# Patient Record
Sex: Female | Born: 1980 | Race: White | Hispanic: No | Smoking: Former smoker
Health system: Southern US, Community
[De-identification: ages and names within clinical notes are randomized; demographics above are authoritative.]

## PROBLEM LIST (undated history)

## (undated) DIAGNOSIS — F1910 Other psychoactive substance abuse, uncomplicated: Secondary | ICD-10-CM

## (undated) HISTORY — PX: APPENDECTOMY: SHX54

---

## 2005-11-09 ENCOUNTER — Emergency Department: Payer: Self-pay | Admitting: Emergency Medicine

## 2006-10-06 ENCOUNTER — Emergency Department: Payer: Self-pay | Admitting: Emergency Medicine

## 2007-09-03 ENCOUNTER — Emergency Department: Payer: Self-pay | Admitting: Emergency Medicine

## 2007-09-04 ENCOUNTER — Inpatient Hospital Stay: Payer: Self-pay | Admitting: Vascular Surgery

## 2010-07-10 ENCOUNTER — Emergency Department: Payer: Self-pay | Admitting: Emergency Medicine

## 2011-02-03 ENCOUNTER — Emergency Department: Payer: Self-pay | Admitting: Emergency Medicine

## 2011-02-07 ENCOUNTER — Emergency Department: Payer: Self-pay | Admitting: Internal Medicine

## 2011-12-23 ENCOUNTER — Emergency Department: Payer: Self-pay | Admitting: Unknown Physician Specialty

## 2011-12-24 LAB — URINALYSIS, COMPLETE
Bacteria: NONE SEEN
Bilirubin,UR: NEGATIVE
Glucose,UR: NEGATIVE mg/dL (ref 0–75)
Ketone: NEGATIVE
Nitrite: NEGATIVE
Protein: NEGATIVE
WBC UR: <1 /HPF (ref 0–5)

## 2011-12-24 LAB — PREGNANCY, URINE: Pregnancy Test, Urine: NEGATIVE m[IU]/mL

## 2012-02-01 ENCOUNTER — Emergency Department: Payer: Self-pay | Admitting: Emergency Medicine

## 2012-05-09 ENCOUNTER — Emergency Department: Payer: Self-pay | Admitting: Emergency Medicine

## 2012-05-09 LAB — URINALYSIS, COMPLETE
Ketone: NEGATIVE
Leukocyte Esterase: NEGATIVE
Ph: 7 (ref 4.5–8.0)
Protein: NEGATIVE
WBC UR: <1 /HPF (ref 0–5)

## 2012-05-09 LAB — CBC
HGB: 13.2 g/dL (ref 12.0–16.0)
MCH: 31.5 pg (ref 26.0–34.0)
MCHC: 32.8 g/dL (ref 32.0–36.0)
MCV: 96 fL (ref 80–100)
Platelet: 300 10*3/uL (ref 150–440)
RBC: 4.2 10*6/uL (ref 3.80–5.20)
RDW: 12.1 % (ref 11.5–14.5)
WBC: 11 10*3/uL (ref 3.6–11.0)

## 2012-05-09 LAB — HCG, QUANTITATIVE, PREGNANCY: Beta Hcg, Quant.: <1 m[IU]/mL — ABNORMAL LOW

## 2012-07-06 ENCOUNTER — Emergency Department: Payer: Self-pay | Admitting: Emergency Medicine

## 2013-10-14 ENCOUNTER — Emergency Department: Payer: Self-pay | Admitting: Emergency Medicine

## 2013-12-23 ENCOUNTER — Emergency Department: Payer: Self-pay | Admitting: Emergency Medicine

## 2014-11-03 ENCOUNTER — Emergency Department: Payer: Self-pay | Admitting: Emergency Medicine

## 2014-11-28 ENCOUNTER — Ambulatory Visit: Payer: Self-pay | Admitting: Otolaryngology

## 2014-12-04 ENCOUNTER — Ambulatory Visit: Payer: Self-pay | Admitting: Otolaryngology

## 2015-11-26 ENCOUNTER — Emergency Department
Admission: EM | Admit: 2015-11-26 | Discharge: 2015-11-26 | Disposition: A | Discharge status: Home / Self Care | Payer: Self-pay | Attending: Emergency Medicine | Admitting: Emergency Medicine

## 2015-11-26 ENCOUNTER — Encounter: Payer: Self-pay | Admitting: Medical Oncology

## 2015-11-26 ENCOUNTER — Emergency Department: Payer: Medicaid Other

## 2015-11-26 DIAGNOSIS — N760 Acute vaginitis: Secondary | ICD-10-CM | POA: Insufficient documentation

## 2015-11-26 DIAGNOSIS — B9689 Other specified bacterial agents as the cause of diseases classified elsewhere: Secondary | ICD-10-CM

## 2015-11-26 DIAGNOSIS — N739 Female pelvic inflammatory disease, unspecified: Secondary | ICD-10-CM | POA: Insufficient documentation

## 2015-11-26 DIAGNOSIS — F17200 Nicotine dependence, unspecified, uncomplicated: Secondary | ICD-10-CM | POA: Insufficient documentation

## 2015-11-26 DIAGNOSIS — Z880 Allergy status to penicillin: Secondary | ICD-10-CM | POA: Insufficient documentation

## 2015-11-26 DIAGNOSIS — Z3202 Encounter for pregnancy test, result negative: Secondary | ICD-10-CM | POA: Insufficient documentation

## 2015-11-26 LAB — URINALYSIS COMPLETE WITH MICROSCOPIC (ARMC ONLY)
BILIRUBIN URINE: NEGATIVE
Bacteria, UA: NONE SEEN
GLUCOSE, UA: NEGATIVE mg/dL
KETONES UR: NEGATIVE mg/dL
NITRITE: NEGATIVE
PH: 5 (ref 5.0–8.0)
Protein, ur: 30 mg/dL — AB
SPECIFIC GRAVITY, URINE: 1.028 (ref 1.005–1.030)

## 2015-11-26 LAB — COMPREHENSIVE METABOLIC PANEL
ALT: 16 U/L (ref 14–54)
AST: 18 U/L (ref 15–41)
Albumin: 3.6 g/dL (ref 3.5–5.0)
Alkaline Phosphatase: 65 U/L (ref 38–126)
Anion gap: 7 (ref 5–15)
BILIRUBIN TOTAL: 0.7 mg/dL (ref 0.3–1.2)
BUN: 9 mg/dL (ref 6–20)
CALCIUM: 9.2 mg/dL (ref 8.9–10.3)
CHLORIDE: 99 mmol/L — AB (ref 101–111)
CO2: 30 mmol/L (ref 22–32)
CREATININE: 0.62 mg/dL (ref 0.44–1.00)
Glucose, Bld: 90 mg/dL (ref 65–99)
Potassium: 3.3 mmol/L — ABNORMAL LOW (ref 3.5–5.1)
Sodium: 136 mmol/L (ref 135–145)
TOTAL PROTEIN: 7.2 g/dL (ref 6.5–8.1)

## 2015-11-26 LAB — POCT PREGNANCY, URINE: Preg Test, Ur: NEGATIVE

## 2015-11-26 LAB — CHLAMYDIA/NGC RT PCR (ARMC ONLY)
Chlamydia Tr: NOT DETECTED
N GONORRHOEAE: DETECTED — AB

## 2015-11-26 LAB — WET PREP, GENITAL
Clue Cells Wet Prep HPF POC: POSITIVE — AB
SPERM: NONE SEEN
Trich, Wet Prep: NONE SEEN
Yeast Wet Prep HPF POC: NONE SEEN

## 2015-11-26 LAB — CBC
HCT: 38.4 % (ref 35.0–47.0)
Hemoglobin: 12.9 g/dL (ref 12.0–16.0)
MCH: 30.7 pg (ref 26.0–34.0)
MCHC: 33.6 g/dL (ref 32.0–36.0)
MCV: 91.3 fL (ref 80.0–100.0)
PLATELETS: 352 10*3/uL (ref 150–440)
RBC: 4.21 MIL/uL (ref 3.80–5.20)
RDW: 12.5 % (ref 11.5–14.5)
WBC: 18 10*3/uL — AB (ref 3.6–11.0)

## 2015-11-26 LAB — LIPASE, BLOOD: LIPASE: 20 U/L (ref 11–51)

## 2015-11-26 MED ORDER — DOXYCYCLINE HYCLATE 100 MG PO TABS
100.0000 mg | ORAL_TABLET | Freq: Once | ORAL | Status: AC
  Administered 2015-11-26: 100 mg via ORAL
  Filled 2015-11-26 (×2): qty 1

## 2015-11-26 MED ORDER — MORPHINE SULFATE (PF) 4 MG/ML IV SOLN
4.0000 mg | Freq: Once | INTRAVENOUS | Status: AC
  Administered 2015-11-26: 4 mg via INTRAVENOUS
  Filled 2015-11-26: qty 1

## 2015-11-26 MED ORDER — MORPHINE SULFATE (PF) 4 MG/ML IV SOLN
4.0000 mg | Freq: Once | INTRAVENOUS | Status: AC
  Administered 2015-11-26: 4 mg via INTRAVENOUS

## 2015-11-26 MED ORDER — SODIUM CHLORIDE 0.9 % IV BOLUS (SEPSIS)
1000.0000 mL | Freq: Once | INTRAVENOUS | Status: AC
  Administered 2015-11-26: 1000 mL via INTRAVENOUS

## 2015-11-26 MED ORDER — OXYCODONE-ACETAMINOPHEN 5-325 MG PO TABS: 1.0000 | ORAL_TABLET | Freq: Four times a day (QID) | ORAL | Status: DC | PRN

## 2015-11-26 MED ORDER — DOXYCYCLINE HYCLATE 100 MG PO TABS: 100.0000 mg | ORAL_TABLET | Freq: Two times a day (BID) | ORAL | Status: DC

## 2015-11-26 MED ORDER — OXYCODONE-ACETAMINOPHEN 5-325 MG PO TABS
ORAL_TABLET | ORAL | Status: AC
  Filled 2015-11-26: qty 2

## 2015-11-26 MED ORDER — ONDANSETRON HCL 4 MG/2ML IJ SOLN
4.0000 mg | Freq: Once | INTRAMUSCULAR | Status: AC
  Administered 2015-11-26: 4 mg via INTRAVENOUS
  Filled 2015-11-26: qty 2

## 2015-11-26 MED ORDER — METRONIDAZOLE 500 MG PO TABS: 500.0000 mg | ORAL_TABLET | Freq: Three times a day (TID) | ORAL | Status: AC

## 2015-11-26 MED ORDER — DEXTROSE 5 % IV SOLN
1.0000 g | Freq: Once | INTRAVENOUS | Status: AC
  Administered 2015-11-26: 1 g via INTRAVENOUS
  Filled 2015-11-26 (×2): qty 10

## 2015-11-26 MED ORDER — PROMETHAZINE HCL 25 MG PO TABS: 25.0000 mg | ORAL_TABLET | Freq: Four times a day (QID) | ORAL | Status: DC | PRN

## 2015-11-26 MED ORDER — OXYCODONE-ACETAMINOPHEN 5-325 MG PO TABS
2.0000 | ORAL_TABLET | Freq: Once | ORAL | Status: AC
  Administered 2015-11-26: 2 via ORAL

## 2015-11-26 NOTE — ED Notes (Signed)
Pt reports that she had her period last week and then today she reports she began having spotting and lower abd pain today. Pt reports she can feel like a cyst when she feels in her vagina.

## 2015-11-26 NOTE — ED Provider Notes (Signed)
[facility] Emergency Department Provider Note  Time seen: 5:32 PM  I have reviewed the triage vital signs and the nursing notes.   HISTORY  Chief Complaint Abdominal Pain    HPI Melanie Howell is a 35 y.o. female with no past medical history who presents to the emergency department with lower abdominal pain. Patient states for the past 3-4 days she has been having lower abdominal pain which has progressively worsened. States she also is having intermittent vaginal spotting. Someone told her it sounds like an ovarian cyst, but the patient states she has never had ovarian cysts in the past. She also states for the past several weeks she has had thick vaginal discharge. Patient's last sexual encounter was approximately 3-4 months ago. Denies dysuria, fever, hematuria. Describes her lower abdominal pain as a 10/10, mostly located in the left lower quadrant. Dull aching pain.     History reviewed. No pertinent past medical history.  There are no active problems to display for this patient.   Past Surgical History  Procedure Laterality Date  . Appendectomy      No current outpatient prescriptions on file.  Allergies Penicillins  No family history on file.  Social History Social History  Substance Use Topics  . Smoking status: Current Some Day Smoker  . Smokeless tobacco: None  . Alcohol Use: No    Review of Systems Constitutional: Negative for fever. Cardiovascular: Negative for chest pain. Respiratory: Negative for shortness of breath. Gastrointestinal: Positive for significant lower abdominal pain. Denies vomiting or diarrhea. Genitourinary: Negative for dysuria. Intermittent vaginal spotting with thick vaginal discharge. Musculoskeletal: Mild lower back pain. Neurological: Negative for headache 10-point ROS otherwise negative.  ____________________________________________   PHYSICAL EXAM:  VITAL SIGNS: ED Triage Vitals  Enc Vitals  Group     BP 11/26/15 1625 130/78 mmHg     Pulse Rate 11/26/15 1625 110     Resp 11/26/15 1625 20     Temp 11/26/15 1625 98.9 F (37.2 C)     Temp Source 11/26/15 1625 Oral     SpO2 11/26/15 1625 100 %     Weight 11/26/15 1625 174 lb (78.926 kg)     Height 11/26/15 1625 5\' 4"  (1.626 m)     Head Cir --      Peak Flow --      Pain Score 11/26/15 1626 7     Pain Loc --      Pain Edu? --      Excl. in GC? --     Constitutional: Alert and oriented. Well appearing and in no distress. Eyes: Normal exam ENT   Head: Normocephalic and atraumatic.   Mouth/Throat: Mucous membranes are moist. Cardiovascular: Normal rate, regular rhythm. No murmur Respiratory: Normal respiratory effort without tachypnea nor retractions. Breath sounds are clear and equal bilaterally. No wheezes/rales/rhonchi. Gastrointestinal: Soft, moderate lower abdominal tenderness mostly located in the suprapubic to left lower quadrant area. No rebound or guarding. No distention. No CVA tenderness. Musculoskeletal: Nontender with normal range of motion in all extremities.  Neurologic:  Normal speech and language. No gross focal neurologic deficits  Skin:  Skin is warm, dry and intact.  Psychiatric: Mood and affect are normal. Speech and behavior are normal. ____________________________________________     RADIOLOGY  Ultrasound most consistent with endometritis  ____________________________________________    INITIAL IMPRESSION / ASSESSMENT AND PLAN / ED COURSE  Pertinent labs & imaging results that were available during my care of the patient were reviewed by  me and considered in my medical decision making (see chart for details).  Patient presents to the emergency department with lower abdominal pain which has progressed over the past 3 days. Also with intermittent spotting, and thick vaginal discharge. Labs shows significantly elevated white blood cell count of 18,000, otherwise within normal limits with a  negative pregnancy test. Urinalysis does show white blood cells, but no bacteria. Patient denies dysuria, this is more likely related to her thick vaginal discharge. We will proceed with pelvic ultrasound, and pelvic examination. Differential this time with include ovarian cyst, hemorrhagic cyst, PID, or UTI.  Pelvic exam shows significant amount of thick white/yellow vaginal discharge coming from the cervix. Significant cervical tenderness as well as right adnexal tenderness. We will treat for PID, I have sent swabs. We will obtain an ultrasound rule out TOA.  Ultrasound consistent with endometritis, no TOA, gonorrhea and bacterial vaginitis have returned positive. We'll place the patient on Flagyl and doxycycline gone home along with pain and nausea medication. The patient is agreeable to plan. I offered the patient admission to the hospital, she would prefer to go home.  ____________________________________________   FINAL CLINICAL IMPRESSION(S) / ED DIAGNOSES  Pelvic inflammatory disease  Minna Antis, MD 11/26/15 2023

## 2015-11-26 NOTE — Discharge Instructions (Signed)
Please take your medications as prescribed. As we discussed please return to the emergency department if you begin vomiting, develop a fever, worsening abdominal pain, or any other symptom personally concerning to yourself. Otherwise please follow up with her primary care physician 1-2 days for recheck.   Bacterial Vaginosis Bacterial vaginosis is an infection of the vagina. It happens when too many germs (bacteria) grow in the vagina. Having this infection puts you at risk for getting other infections from sex. Treating this infection can help lower your risk for other infections, such as:   Chlamydia.  Gonorrhea.  HIV.  Herpes. HOME CARE  Take your medicine as told by your doctor.  Finish your medicine even if you start to feel better.  Tell your sex partner that you have an infection. They should see their doctor for treatment.  During treatment:  Avoid sex or use condoms correctly.  Do not douche.  Do not drink alcohol unless your doctor tells you it is ok.  Do not breastfeed unless your doctor tells you it is ok. GET HELP IF:  You are not getting better after 3 days of treatment.  You have more grey fluid (discharge) coming from your vagina than before.  You have more pain than before.  You have a fever. MAKE SURE YOU:   Understand these instructions.  Will watch your condition.  Will get help right away if you are not doing well or get worse.   This information is not intended to replace advice given to you by your health care provider. Make sure you discuss any questions you have with your health care provider.   Document Released: 08/16/2008 Document Revised: 11/28/2014 Document Reviewed: 06/19/2013 Elsevier Interactive Patient Education 2016 Elsevier Inc.  Pelvic Inflammatory Disease Pelvic inflammatory disease (PID) is an infection in some or all of the female organs. PID can be in the uterus, ovaries, fallopian tubes, or the surrounding tissues that  are inside the lower belly area (pelvis). PID can lead to lasting problems if it is not treated. To check for this disease, your doctor may:  Do a physical exam.  Do blood tests, urine tests, or a pregnancy test.  Look at your vaginal discharge.  Do tests to look inside the pelvis.  Test you for other infections. HOME CARE  Take over-the-counter and prescription medicines only as told by your doctor.  If you were prescribed an antibiotic medicine, take it as told by your doctor. Do not stop taking it even if you start to feel better.  Do not have sex until treatment is done or as told by your doctor.  Tell your sex partner if you have PID. Your partner may need to be treated.  Keep all follow-up visits as told by your doctor. This is important.  Your doctor may test you for infection again 3 months after you are treated. GET HELP IF:  You have more fluid (discharge) coming from your vagina or fluid that is not normal.  Your pain does not improve.  You throw up (vomit).  You have a fever.  You cannot take your medicines.  Your partner has a sexually transmitted disease (STD).  You have pain when you pee (urinate). GET HELP RIGHT AWAY IF:  You have more belly (abdominal) or lower belly pain.  You have chills.  You are not better after 72 hours.   This information is not intended to replace advice given to you by your health care provider. Make sure you discuss  any questions you have with your health care provider.   Document Released: 02/03/2009 Document Revised: 07/29/2015 Document Reviewed: 12/15/2014 Elsevier Interactive Patient Education [employer] Inc.

## 2015-11-26 NOTE — ED Notes (Signed)
Reviewed d/c instructions, prescriptions, follow-up care, and use of heat packs with pt. Pt verbalized understanding.

## 2015-11-26 NOTE — ED Notes (Signed)
MD and ED tech at bedside for pelvic.

## 2015-11-30 ENCOUNTER — Telehealth: Payer: Self-pay | Admitting: Emergency Medicine

## 2015-11-30 NOTE — ED Notes (Signed)
Called patient to tell her std results.  gc positive.  Was treated with ceftriaxone and doxy.  Pt says she could not afford the doxycycline, sohas not continued that.  i told her that it is very important that she take the course of doxycycline due to the PID and that she could be come septic.  She agrees to  Go get the doxy today.

## 2017-04-23 IMAGING — US US PELVIS COMPLETE
1 series · 13 of 25 positions shown · non-contrast
Comparison: None

CLINICAL DATA: Lower abdominal pain for 4 days. Pelvic inflammatory
disease.

EXAM:
TRANSABDOMINAL AND TRANSVAGINAL ULTRASOUND OF PELVIS
TECHNIQUE: Both transabdominal and transvaginal ultrasound examinations of the
pelvis were performed. Transabdominal technique was performed for
global imaging of the pelvis including uterus, ovaries, adnexal
regions, and pelvic cul-de-sac. It was necessary to proceed with
endovaginal exam following the transabdominal exam to visualize the
ovaries and adnexa.

[Series 1: us pelvis complete · 0.25mm/px · 13 of 145 slices shown]
[im 1/145]
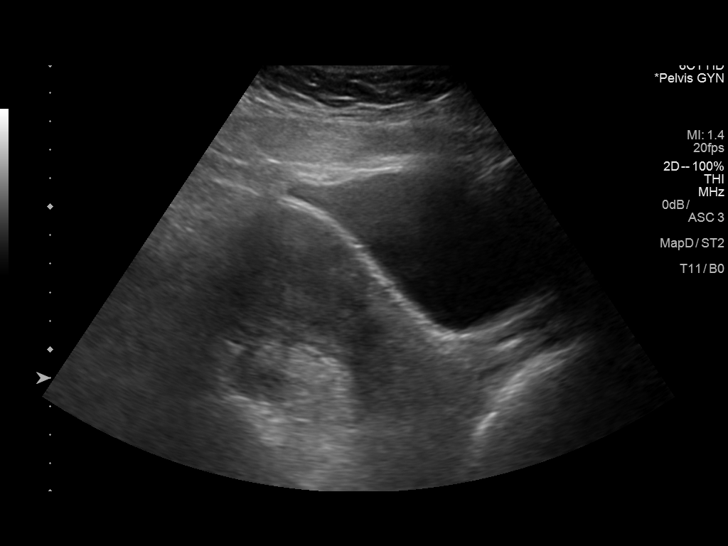
[im 13/145]
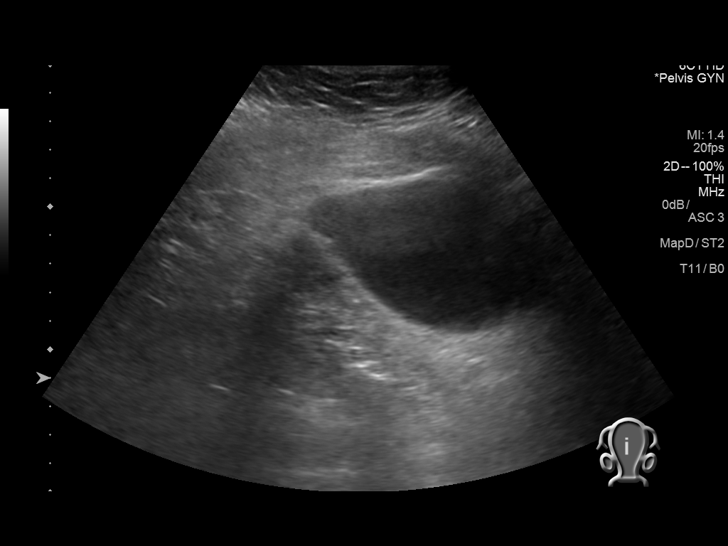
[im 25/145]
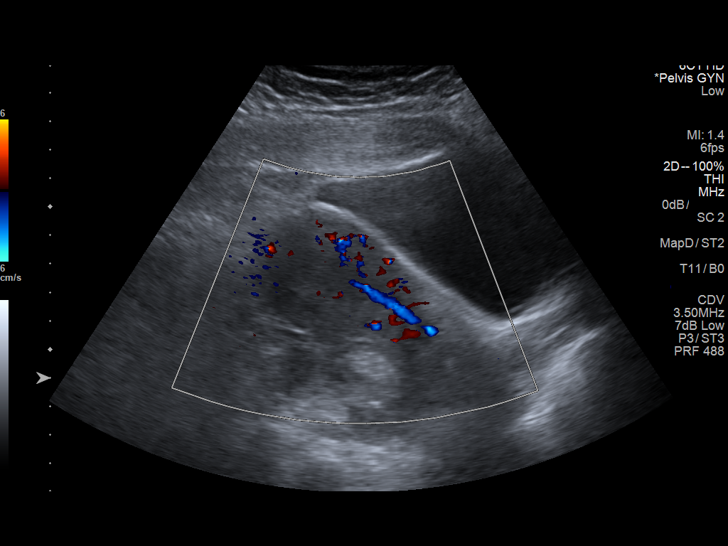
[im 37/145]
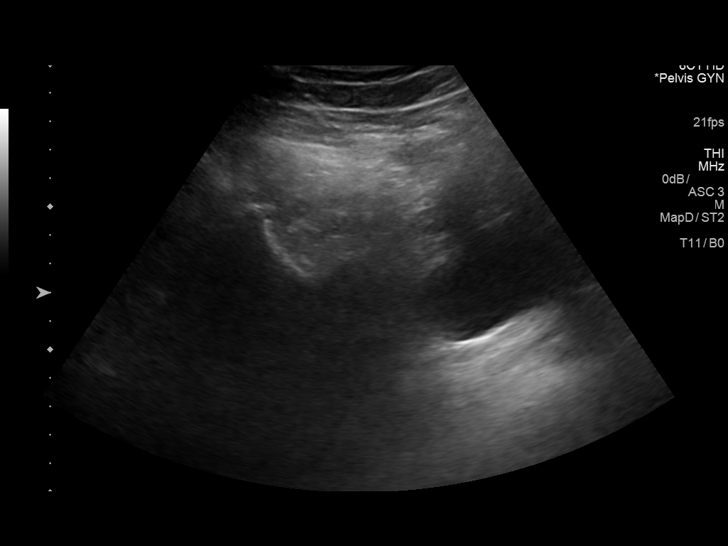
[im 49/145]
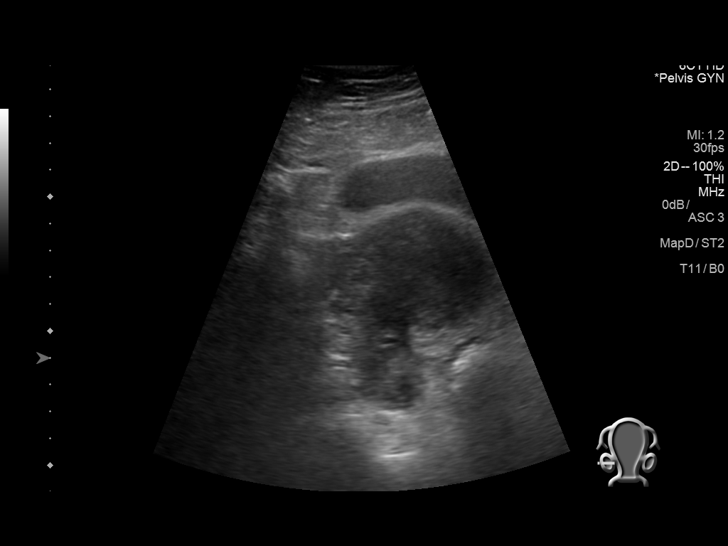
[im 61/145]
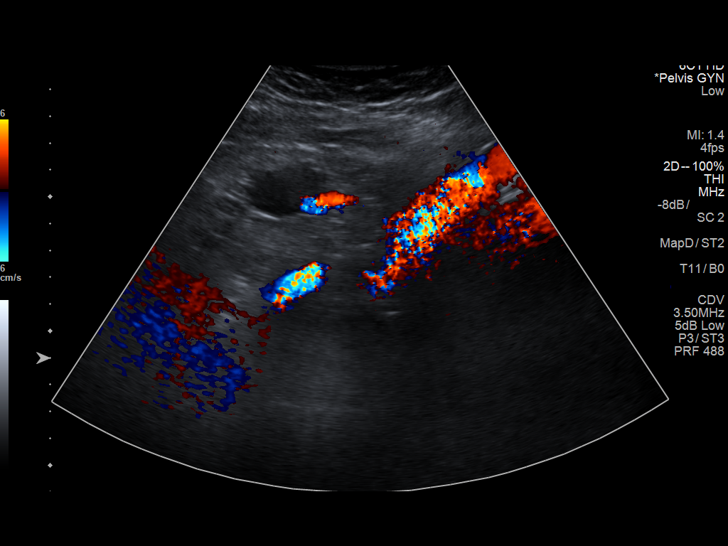
[im 73/145]
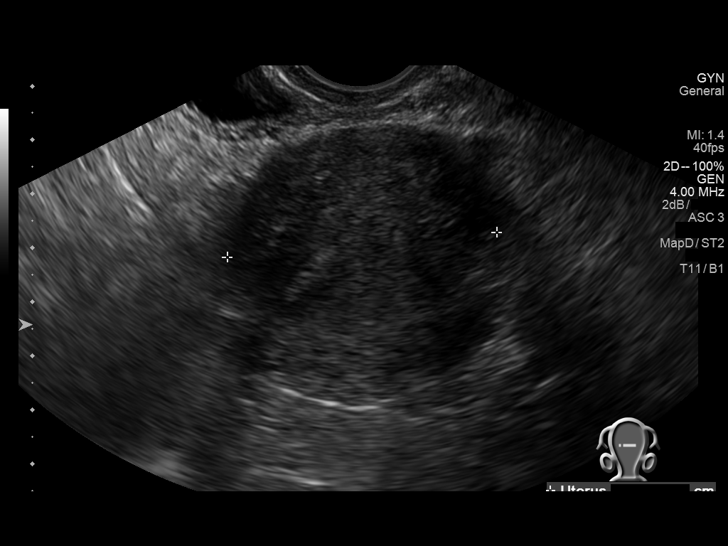
[im 85/145]
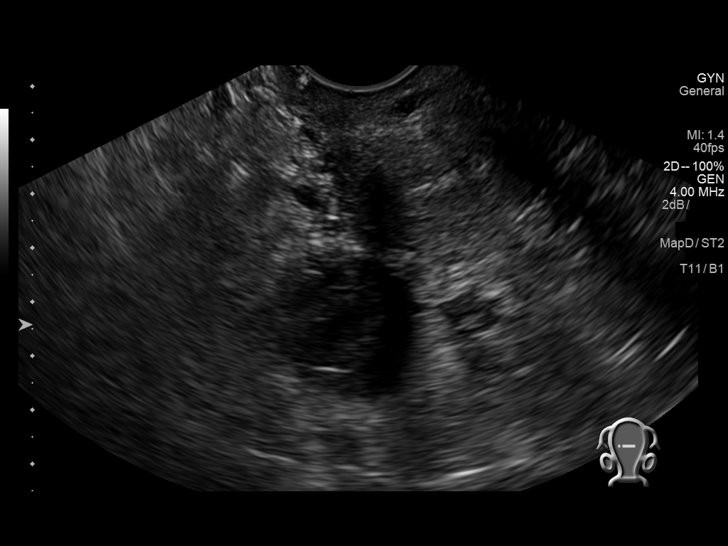
[im 97/145]
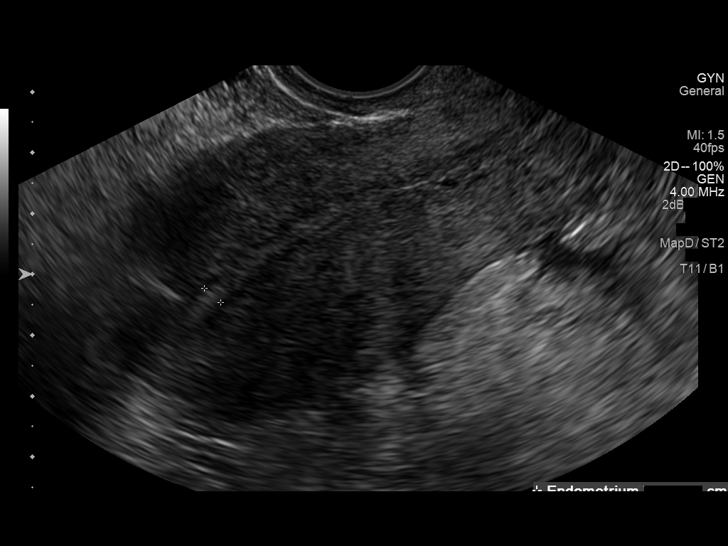
[im 109/145]
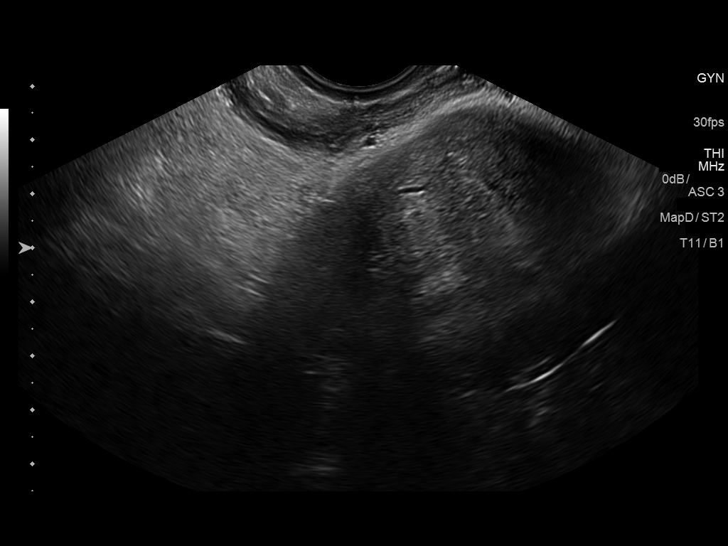
[im 121/145]
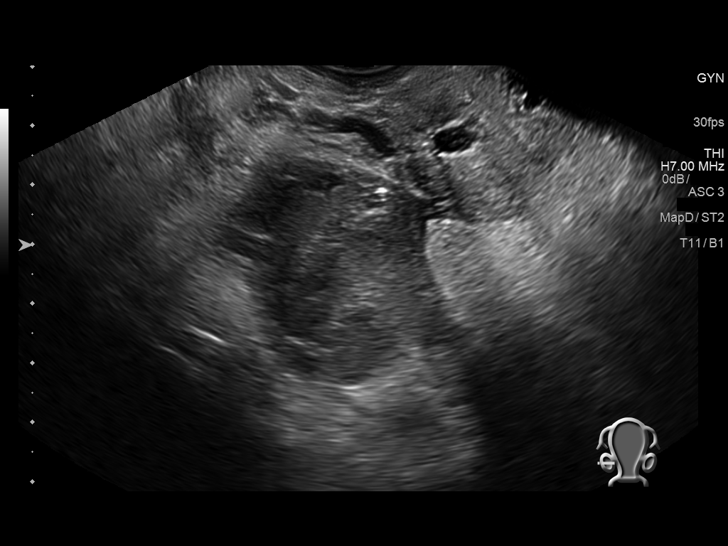
[im 133/145]
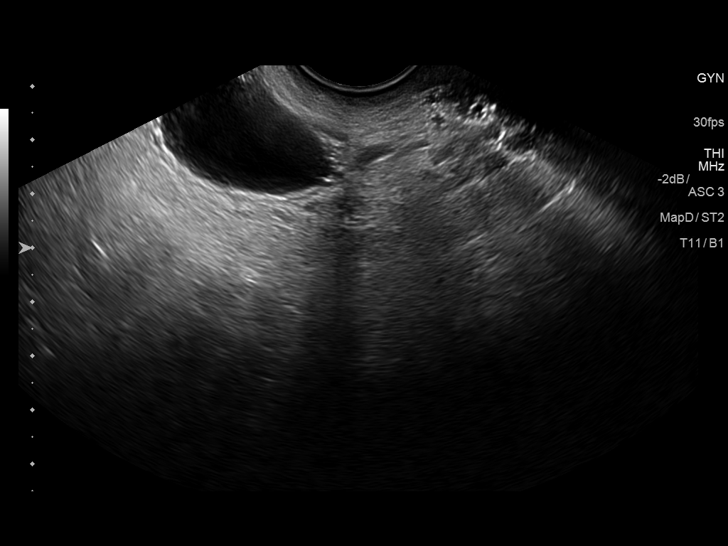
[im 145/145]
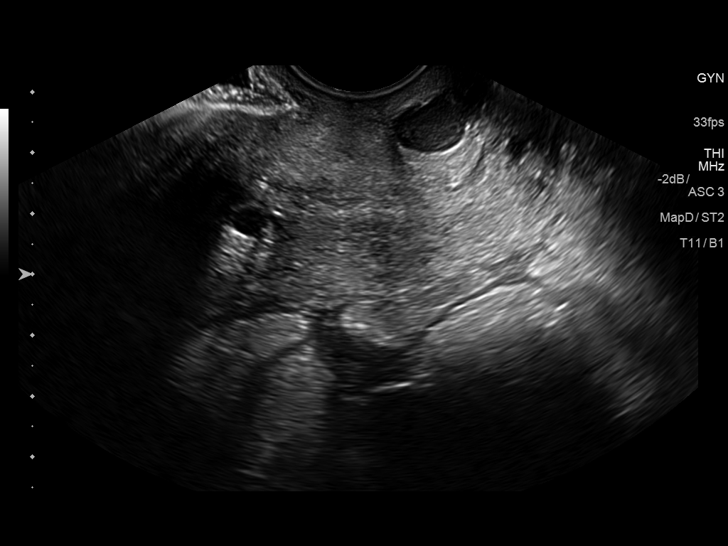

[13 of 25 positions shown; findings below may reference images not displayed]

FINDINGS: Uterus

Measurements: 10.4 x 4.4 x 5.0 cm. No fibroids or other mass
visualized. Question of increased myometrial vascularity.

Endometrium

Thickness: 3.6 mm. Endometrium vascularity appears prominent. No
focal abnormality visualized.

Right ovary

Measurements: 4.8 x 3.6 x 3.4 cm. An anechoic cyst within the right
ovary measures 2.3 x 2.5 x 2.2 cm. Adjacent to this cyst is a 2.5 x
2.8 x 2.3 cm heterogeneous region with internal blood flow.

Left ovary

Not identified.  No evidence of

Other findings

No abnormal free fluid.
IMPRESSION: 1. Prominent uterine and endometrial vascularity raises concern for
infection such as endometritis, particularly given clinical concern
for pelvic inflammatory disease.
2. Complex heterogeneous region in the right adnexa that may be
within or immediately adjacent to the right ovary measuring 2.8 cm.
This does not have the appearance of a tubo-ovarian abscess, and
internal flow makes complex cyst unlikely. Short-interval follow up
ultrasound in 6-12 weeks is recommended, preferably during the week
following the patient's normal menses.
3. Left ovary is not seen.  No adnexal mass.

## 2017-11-02 ENCOUNTER — Emergency Department
Admission: EM | Admit: 2017-11-02 | Discharge: 2017-11-02 | Disposition: A | Discharge status: Home / Self Care | Payer: Self-pay | Attending: Emergency Medicine | Admitting: Emergency Medicine

## 2017-11-02 ENCOUNTER — Encounter: Payer: Self-pay | Admitting: Emergency Medicine

## 2017-11-02 ENCOUNTER — Other Ambulatory Visit: Payer: Self-pay

## 2017-11-02 ENCOUNTER — Emergency Department: Payer: Self-pay

## 2017-11-02 DIAGNOSIS — S060X0A Concussion without loss of consciousness, initial encounter: Secondary | ICD-10-CM | POA: Insufficient documentation

## 2017-11-02 DIAGNOSIS — F17200 Nicotine dependence, unspecified, uncomplicated: Secondary | ICD-10-CM | POA: Insufficient documentation

## 2017-11-02 DIAGNOSIS — Y9259 Other trade areas as the place of occurrence of the external cause: Secondary | ICD-10-CM | POA: Insufficient documentation

## 2017-11-02 DIAGNOSIS — W000XXA Fall on same level due to ice and snow, initial encounter: Secondary | ICD-10-CM | POA: Insufficient documentation

## 2017-11-02 DIAGNOSIS — Y998 Other external cause status: Secondary | ICD-10-CM | POA: Insufficient documentation

## 2017-11-02 DIAGNOSIS — Y9389 Activity, other specified: Secondary | ICD-10-CM | POA: Insufficient documentation

## 2017-11-02 DIAGNOSIS — Z79899 Other long term (current) drug therapy: Secondary | ICD-10-CM | POA: Insufficient documentation

## 2017-11-02 DIAGNOSIS — Y92481 Parking lot as the place of occurrence of the external cause: Secondary | ICD-10-CM | POA: Insufficient documentation

## 2017-11-02 MED ORDER — NAPROXEN 500 MG PO TABS: 500.0000 mg | ORAL_TABLET | Freq: Two times a day (BID) | ORAL | 0 refills | Status: DC

## 2017-11-02 MED ORDER — ONDANSETRON 4 MG PO TBDP: 4.0000 mg | ORAL_TABLET | Freq: Three times a day (TID) | ORAL | 0 refills | Status: DC | PRN

## 2017-11-02 NOTE — ED Triage Notes (Signed)
States she fell while at [employer] having pain to wrist

## 2017-11-02 NOTE — ED Provider Notes (Signed)
[facility] Emergency Department Provider Note ____________________________________________  Time seen: Approximately 4:19 PM  I have reviewed the triage vital signs and the nursing notes.   HISTORY  Chief Complaint Fall   HPI Melanie Howell is a 36 y.o. female who presents to the emergency department for treatment and evaluation after sustaining a mechanical, non-syncopal fall due to slipping on the ice in the parking lot at [employer] [facility].  She states that her feet slipped out from under her and she landed on the right side of her body, striking her head on the pavement.  She states that she was able to get up and get back into her truck, but felt very dizzy..  She states that nausea, headache, and dizziness have continued.  She is in college, has a job, and children.  She states that she has been either unable or slow to complete her normal activities of daily living since the fall.  She has not taken any medications for symptoms. Location: Left parietal, temporal, and occipital Similar to previous headaches: No Duration: Constant TIMING: After fall SEVERITY: 7/10 constant throbbing QUALITY: Dull CONTEXT: Posttraumatic MODIFYING FACTORS: None ASSOCIATED SYMPTOMS: Dizziness, nausea, headache, and pain behind the left eye History reviewed. No pertinent past medical history.  There are no active problems to display for this patient.   Past Surgical History:  Procedure Laterality Date  . APPENDECTOMY      Prior to Admission medications   Medication Sig Start Date End Date Taking? Authorizing Provider  doxycycline (VIBRA-TABS) 100 MG tablet Take 1 tablet (100 mg total) by mouth 2 (two) times daily. 11/26/15   Minna AntisPaduchowski, Kevin, MD  naproxen (NAPROSYN) 500 MG tablet Take 1 tablet (500 mg total) by mouth 2 (two) times daily with a meal. 11/02/17   Harmoney Sienkiewicz B, FNP  ondansetron (ZOFRAN-ODT) 4 MG disintegrating tablet Take 1 tablet (4 mg total) by  mouth every 8 (eight) hours as needed for nausea or vomiting. 11/02/17   Jamani Eley B, FNP  oxyCODONE-acetaminophen (ROXICET) 5-325 MG tablet Take 1 tablet by mouth every 6 (six) hours as needed. 11/26/15   Minna AntisPaduchowski, Kevin, MD  promethazine (PHENERGAN) 25 MG tablet Take 1 tablet (25 mg total) by mouth every 6 (six) hours as needed for nausea or vomiting. 11/26/15   Minna AntisPaduchowski, Kevin, MD    Allergies Penicillins  No family history on file.  Social History Social History   Tobacco Use  . Smoking status: Current Some Day Smoker  . Smokeless tobacco: Never Used  Substance Use Topics  . Alcohol use: No  . Drug use: Not on file    Review of Systems Constitutional: No fever/chills or recent injury. Eyes: No visual changes.  Positive for pain behind the left eye ENT: No sore throat. Respiratory: Denies shortness of breath. Gastrointestinal: No abdominal pain.  Positive for nausea, no vomiting.  No diarrhea.  No constipation. Musculoskeletal: Positive for neck, bilateral upper extremity pain. Skin: Negative for rash. Neurological:Positive for headache, negative for focal weakness or numbness. No confusion or fainting. ___________________________________________   PHYSICAL EXAM:  VITAL SIGNS: ED Triage Vitals  Enc Vitals Group     BP 11/02/17 1558 116/72     Pulse Rate 11/02/17 1558 87     Resp 11/02/17 1558 16     Temp 11/02/17 1558 98.4 F (36.9 C)     Temp src --      SpO2 11/02/17 1558 100 %     Weight 11/02/17 1558 170 lb (  77.1 kg)     Height 11/02/17 1558 5\' 6"  (1.676 m)     Head Circumference --      Peak Flow --      Pain Score 11/02/17 1536 6     Pain Loc --      Pain Edu? --      Excl. in GC? --     Constitutional: Alert and oriented. Well appearing and in no acute distress. Eyes: Conjunctivae are normal. PERRL. EOMI.  Head: Tender to palpation over right parietal scalp Nose: No congestion/rhinnorhea. Mouth/Throat: Mucous membranes are moist.  Oropharynx  non-erythematous. Neck: No stridor. No meningismus. Nexus criteria is negative. Cardiovascular: Normal rate, regular rhythm. Grossly normal heart sounds.  Good peripheral circulation. Respiratory: Normal respiratory effort.  No retractions. Lungs CTAB. Musculoskeletal: No lower extremity tenderness nor edema.  No joint effusions. Neurologic:  Normal speech and language. No gross focal neurologic deficits are appreciated. No gait instability. Cranial nerves: 2-10 normal as tested. Cerebellar:Normal Romberg, finger-nose-finger, normal gait. Sensorimotor: No aphasia, pronator drift, clonus, sensory loss or abnormal reflexes.  Skin:  Skin is warm, dry and intact. No rash noted. Psychiatric: Mood and affect are normal. Speech and behavior are normal. Normal thought process and cognition.  ____________________________________________   LABS (all labs ordered are listed, but only abnormal results are displayed)  Labs Reviewed - No data to display ____________________________________________  EKG  Not indicated. ____________________________________________  RADIOLOGY  CT negative for intracranial mass or hemorrhage per radiology. ____________________________________________   PROCEDURES  Procedure(s) performed: None  Critical Care performed: None ____________________________________________   INITIAL IMPRESSION / ASSESSMENT AND PLAN / ED COURSE  36 year old female presenting to the emergency department for evaluation treatment after sustaining a mechanical, non-syncopal fall yesterday while walking into the drugstore.  Symptoms and exam most consistent with concussion, however do to the pain behind the left eye a CT scan has been ordered.  Patient is agreeable to treatment.  ----------------------------------------- 5:08 PM on 11/02/2017 -----------------------------------------  CT negative for emergent concerns. Symptoms of concussion were discussed with the patient. She will  be given prescriptions for zofran and naprosyn. She was encouraged to return to the ER for symptoms that change or worsen or for new concerns.  Pertinent labs & imaging results that were available during my care of the patient were reviewed by me and considered in my medical decision making (see chart for details). ____________________________________________   FINAL CLINICAL IMPRESSION(S) / ED DIAGNOSES  Final diagnoses:  Concussion without loss of consciousness, initial encounter      Chinita Pesterriplett, Derya Dettmann B, FNP 11/02/17 1818    Phineas SemenGoodman, Graydon, MD 11/02/17 [phone]

## 2017-11-02 NOTE — ED Notes (Signed)
Pt states fell yesterday and hit her head, feeling lightheaded and getting head when she uses laptop, and nauseous at times

## 2018-03-05 ENCOUNTER — Emergency Department: Payer: Self-pay

## 2018-03-05 ENCOUNTER — Encounter: Payer: Self-pay | Admitting: Emergency Medicine

## 2018-03-05 ENCOUNTER — Emergency Department
Admission: EM | Admit: 2018-03-05 | Discharge: 2018-03-05 | Disposition: A | Discharge status: Home / Self Care | Payer: Self-pay | Attending: Emergency Medicine | Admitting: Emergency Medicine

## 2018-03-05 ENCOUNTER — Other Ambulatory Visit: Payer: Self-pay

## 2018-03-05 DIAGNOSIS — M5412 Radiculopathy, cervical region: Secondary | ICD-10-CM | POA: Insufficient documentation

## 2018-03-05 DIAGNOSIS — F17200 Nicotine dependence, unspecified, uncomplicated: Secondary | ICD-10-CM | POA: Insufficient documentation

## 2018-03-05 MED ORDER — CYCLOBENZAPRINE HCL 10 MG PO TABS: 10.0000 mg | ORAL_TABLET | Freq: Three times a day (TID) | ORAL | 0 refills | Status: DC | PRN

## 2018-03-05 MED ORDER — PREDNISONE 10 MG (48) PO TBPK: ORAL_TABLET | ORAL | 0 refills | Status: DC

## 2018-03-05 MED ORDER — CYCLOBENZAPRINE HCL 10 MG PO TABS
5.0000 mg | ORAL_TABLET | Freq: Once | ORAL | Status: AC
  Administered 2018-03-05: 5 mg via ORAL
  Filled 2018-03-05: qty 1

## 2018-03-05 NOTE — ED Triage Notes (Signed)
Pain in right lower neck/shoulder from fall in Dec.
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
## 2018-03-05 NOTE — ED Triage Notes (Signed)
[facility] fall [facility], worsening pain in lower neck upper shoulder since then.

## 2018-03-05 NOTE — ED Notes (Signed)
See triage note  Presents with pain to neck and into right shoulder area    States she took a fall in Dec and has had cont's problems  Denies any new injury

## 2018-03-05 NOTE — Discharge Instructions (Addendum)
Follow-up with Dr. Rosita KeaMenz.  Call his office for an appointment.  Take medication as prescribed.  Apply ice to the neck.  Return if worsening.

## 2018-03-20 ENCOUNTER — Other Ambulatory Visit: Payer: Self-pay | Admitting: Sports Medicine

## 2018-03-20 DIAGNOSIS — M4722 Other spondylosis with radiculopathy, cervical region: Secondary | ICD-10-CM

## 2018-03-20 DIAGNOSIS — S161XXA Strain of muscle, fascia and tendon at neck level, initial encounter: Secondary | ICD-10-CM

## 2018-03-28 ENCOUNTER — Ambulatory Visit: Payer: Medicaid Other

## 2021-03-09 ENCOUNTER — Emergency Department
Admission: EM | Admit: 2021-03-09 | Discharge: 2021-03-09 | Disposition: A | Discharge status: Home / Self Care | Payer: PRIVATE HEALTH INSURANCE | Attending: Emergency Medicine | Admitting: Emergency Medicine

## 2021-03-09 ENCOUNTER — Other Ambulatory Visit: Payer: Self-pay

## 2021-03-09 ENCOUNTER — Emergency Department: Payer: PRIVATE HEALTH INSURANCE

## 2021-03-09 DIAGNOSIS — F17200 Nicotine dependence, unspecified, uncomplicated: Secondary | ICD-10-CM | POA: Insufficient documentation

## 2021-03-09 DIAGNOSIS — Y990 Civilian activity done for income or pay: Secondary | ICD-10-CM | POA: Insufficient documentation

## 2021-03-09 DIAGNOSIS — S01511A Laceration without foreign body of lip, initial encounter: Secondary | ICD-10-CM | POA: Diagnosis not present

## 2021-03-09 DIAGNOSIS — R55 Syncope and collapse: Secondary | ICD-10-CM | POA: Diagnosis not present

## 2021-03-09 DIAGNOSIS — W228XXA Striking against or struck by other objects, initial encounter: Secondary | ICD-10-CM | POA: Diagnosis not present

## 2021-03-09 DIAGNOSIS — S0993XA Unspecified injury of face, initial encounter: Secondary | ICD-10-CM | POA: Diagnosis present

## 2021-03-09 MED ORDER — ACETAMINOPHEN 500 MG PO TABS
1000.0000 mg | ORAL_TABLET | Freq: Once | ORAL | Status: AC
  Administered 2021-03-09: 1000 mg via ORAL
  Filled 2021-03-09: qty 2

## 2021-03-09 MED ORDER — LIDOCAINE-EPINEPHRINE-TETRACAINE (LET) TOPICAL GEL
3.0000 mL | Freq: Once | TOPICAL | Status: AC
  Administered 2021-03-09: 3 mL via TOPICAL
  Filled 2021-03-09: qty 3

## 2021-03-09 MED ORDER — NAPROXEN 500 MG PO TABS
500.0000 mg | ORAL_TABLET | Freq: Once | ORAL | Status: AC
  Administered 2021-03-09: 500 mg via ORAL
  Filled 2021-03-09: qty 1
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
## 2021-03-09 NOTE — Discharge Instructions (Addendum)
Please take Tylenol and ibuprofen/Advil for your pain.  It is safe to take them together, or to alternate them every few hours.  Take up to 1000mg  of Tylenol at a time, up to 4 times per day.  Do not take more than 4000 mg of Tylenol in 24 hours.  For ibuprofen, take 400-600 mg, 4-5 times per day.  As discussed, please pick up the antibiotic prescription provided by her dentist a couple weeks ago.  Please finish this full course of antibiotics.  Do not tongue/poke at the stitches that we placed, this could cause it to not heal appropriately.  If you develop any fevers or pus coming from this area, please return to the ED.

## 2021-03-09 NOTE — ED Provider Notes (Signed)
[facility] Emergency Department Provider Note ____________________________________________   Event Date/Time   First MD Initiated Contact with Patient 03/09/21 1323     (approximate)  I have reviewed the triage vital signs and the nursing notes.  HISTORY  Chief Complaint Facial Injury   HPI Melanie Howell is a 40 y.o. femalewho presents to the ED for evaluation of work-related facial injury.  Chart review indicates no relevant medical history.  No thinners.  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  She reports associated dizziness and presyncope without syncope.  Denies falls or further injuries beyond her face.  This happened about 2 hours prior to arrival.  Of note, patient ports getting root canal done to a right lateral maxillary incisor about 2 weeks ago.  She reports he was prescribed antibiotics for this, but never picked them up due to a busy work schedule.  Due to CT findings, I urged her strongly to pick up antibiotics today to finish this course.  She reports no fevers or localized swelling.   History reviewed. No pertinent past medical history.  There are no problems to display for this patient.   Past Surgical History:  Procedure Laterality Date  . APPENDECTOMY      Prior to Admission medications   Medication Sig Start Date End Date Taking? Authorizing Provider  cyclobenzaprine (FLEXERIL) 10 MG tablet Take 1 tablet (10 mg total) by mouth 3 (three) times daily as needed for muscle spasms. 03/05/18   Fisher, Roselyn Bering, PA-C  predniSONE (STERAPRED UNI-PAK 48 TAB) 10 MG (48) TBPK tablet Take 6 pills for 2 days then decrease by 1 pill every 2 days 03/05/18   Faythe Ghee, PA-C    Allergies Penicillins  No family history on file.  Social History Social History   Tobacco Use  . Smoking status: Current Some Day Smoker  . Smokeless tobacco: Never Used   Substance Use Topics  . Alcohol use: No    Review of Systems  Constitutional: No fever/chills Eyes: No visual changes. ENT: No sore throat.  Facial pain after injury. Cardiovascular: Denies chest pain. Respiratory: Denies shortness of breath. Gastrointestinal: No abdominal pain.  No nausea, no vomiting.  No diarrhea.  No constipation. Genitourinary: Negative for dysuria. Musculoskeletal: Negative for back pain. Skin: Negative for rash. Neurological: Negative for headaches, focal weakness or numbness.  ____________________________________________   PHYSICAL EXAM:  VITAL SIGNS: Vitals:   03/09/21 1211  BP: 139/83  Pulse: 90  Resp: 18  Temp: 98.6 F (37 C)  SpO2: 100%    Constitutional: Alert and oriented.  Tearful, but conversational in full sentences.  No distress. Eyes: Conjunctivae are normal. PERRL. EOMI. Head: Small superficial linear abrasion to left maxillary face just inferior to her orbit.  Tender to palpation, but no bony step-offs or signs of EOM entrapment bilaterally. Nose: Mild clear congestion/rhinnorhea.  No epistaxis or signs of nasal septal hematoma. Mouth/Throat: Mucous membranes are moist.  Oropharynx non-erythematous. X-shaped laceration through the mucosal aspect of the left upper lip, just left of the midline.  Does not extend through the vermilion border and there is no significant defect of this area..  Slightly gaping on this mucosal surface and will require repair. No loose teeth. No evidence of periapical abscess throughout. Neck: No stridor. No cervical spine tenderness to palpation. Cardiovascular: Normal rate, regular rhythm. Grossly normal heart sounds.  Good peripheral circulation. Respiratory: Normal respiratory  effort.  No retractions. Lungs CTAB. Gastrointestinal: Soft , nondistended, nontender to palpation. No CVA tenderness. Musculoskeletal: No lower extremity tenderness nor edema.  No joint effusions. No signs of acute  trauma. Neurologic:  Normal speech and language. No gross focal neurologic deficits are appreciated. No gait instability noted. Skin:  Skin is warm, dry and intact. No rash noted. Psychiatric: Mood and affect are normal. Speech and behavior are normal. ____________________________________________  RADIOLOGY  ED MD interpretation: CTs reviewed by me without evidence of ICH or fracture of the calvarium.  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 EXAM: CT HEAD WITHOUT CONTRAST CT MAXILLOFACIAL WITHOUT CONTRAST CT CERVICAL SPINE WITHOUT CONTRAST TECHNIQUE: Multidetector CT imaging of the head, cervical spine, and maxillofacial structures were performed using the standard protocol without intravenous contrast. Multiplanar CT image reconstructions of the cervical spine and maxillofacial structures were also generated. COMPARISON:  Cervical spine x-rays dated March 05, 2018. CT head dated November 02, 2017. FINDINGS: CT HEAD FINDINGS Brain: No evidence of acute infarction, hemorrhage, hydrocephalus, extra-axial collection or mass lesion/mass effect. Vascular: No hyperdense vessel or unexpected calcification. Skull: Normal. Negative for fracture or focal lesion. Other: None. CT MAXILLOFACIAL FINDINGS Osseous: No fracture or mandibular dislocation. Right maxillary lateral incisor and left mandibular second bicuspid dental caries with periapical abscesses. Orbits: Negative. No traumatic or inflammatory finding. Sinuses: Clear. Soft tissues: Mild fat stranding in the left cheek. CT CERVICAL SPINE FINDINGS Alignment: No traumatic malalignment. Straightening of the normal lumbar lordosis. Trace degenerative retrolisthesis at C6-C7. Skull base and vertebrae: No acute fracture. No primary bone lesion or focal pathologic process. Soft tissues and spinal canal: No prevertebral fluid or swelling. No visible canal  hematoma. Disc levels: Mild disc height loss and circumferential disc osteophyte complexes at C5-C6 and C6-C7. Mild C5-C6 and moderate C6-C7 uncovertebral hypertrophy. Mild spinal canal and neuroforaminal stenosis at C6-C7. Upper chest: Negative. Other: None. IMPRESSION: 1. No acute intracranial abnormality. 2. No acute maxillofacial fracture. Mild fat stranding in the left cheek. 3. No acute cervical spine fracture or traumatic listhesis. 4. Right maxillary lateral incisor and left mandibular second bicuspid dental caries with periapical abscesses. Electronically Signed   By: Obie Dredge M.D.   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 EXAM: CT HEAD WITHOUT CONTRAST CT MAXILLOFACIAL WITHOUT CONTRAST CT CERVICAL SPINE WITHOUT CONTRAST TECHNIQUE: Multidetector CT imaging of the head, cervical spine, and maxillofacial structures were performed using the standard protocol without intravenous contrast. Multiplanar CT image reconstructions of the cervical spine and maxillofacial structures were also generated. COMPARISON:  Cervical spine x-rays dated March 05, 2018. CT head dated November 02, 2017. FINDINGS: CT HEAD FINDINGS Brain: No evidence of acute infarction, hemorrhage, hydrocephalus, extra-axial collection or mass lesion/mass effect. Vascular: No hyperdense vessel or unexpected calcification. Skull: Normal. Negative for fracture or focal lesion. Other: None. CT MAXILLOFACIAL FINDINGS Osseous: No fracture or mandibular dislocation. Right maxillary lateral incisor and left mandibular second bicuspid dental caries with periapical abscesses. Orbits: Negative. No traumatic or inflammatory finding. Sinuses: Clear. Soft tissues: Mild fat stranding in the left cheek. CT CERVICAL SPINE FINDINGS Alignment: No traumatic malalignment. Straightening of the normal lumbar lordosis. Trace degenerative  retrolisthesis at C6-C7. Skull base and vertebrae: No acute fracture. No primary bone lesion or focal pathologic process.  Soft tissues and spinal canal: No prevertebral fluid or swelling. No visible canal hematoma. Disc levels: Mild disc height loss and circumferential disc osteophyte complexes at C5-C6 and C6-C7. Mild C5-C6 and moderate C6-C7 uncovertebral hypertrophy. Mild spinal canal and neuroforaminal stenosis at C6-C7. Upper chest: Negative. Other: None. IMPRESSION: 1. No acute intracranial abnormality. 2. No acute maxillofacial fracture. Mild fat stranding in the left cheek. 3. No acute cervical spine fracture or traumatic listhesis. 4. Right maxillary lateral incisor and left mandibular second bicuspid dental caries with periapical abscesses. Electronically Signed   By: Obie DredgeWilliam T Derry M.D.   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 EXAM: CT HEAD WITHOUT CONTRAST CT MAXILLOFACIAL WITHOUT CONTRAST CT CERVICAL SPINE WITHOUT CONTRAST TECHNIQUE: Multidetector CT imaging of the head, cervical spine, and maxillofacial structures were performed using the standard protocol without intravenous contrast. Multiplanar CT image reconstructions of the cervical spine and maxillofacial structures were also generated. COMPARISON:  Cervical spine x-rays dated March 05, 2018. CT head dated November 02, 2017. FINDINGS: CT HEAD FINDINGS Brain: No evidence of acute infarction, hemorrhage, hydrocephalus, extra-axial collection or mass lesion/mass effect. Vascular: No hyperdense vessel or unexpected calcification. Skull: Normal. Negative for fracture or focal lesion. Other: None. CT MAXILLOFACIAL FINDINGS Osseous: No fracture or mandibular dislocation. Right maxillary lateral incisor and left mandibular second bicuspid dental caries with periapical abscesses. Orbits: Negative. No traumatic or inflammatory finding.  Sinuses: Clear. Soft tissues: Mild fat stranding in the left cheek. CT CERVICAL SPINE FINDINGS Alignment: No traumatic malalignment. Straightening of the normal lumbar lordosis. Trace degenerative retrolisthesis at C6-C7. Skull base and vertebrae: No acute fracture. No primary bone lesion or focal pathologic process. Soft tissues and spinal canal: No prevertebral fluid or swelling. No visible canal hematoma. Disc levels: Mild disc height loss and circumferential disc osteophyte complexes at C5-C6 and C6-C7. Mild C5-C6 and moderate C6-C7 uncovertebral hypertrophy. Mild spinal canal and neuroforaminal stenosis at C6-C7. Upper chest: Negative. Other: None. IMPRESSION: 1. No acute intracranial abnormality. 2. No acute maxillofacial fracture. Mild fat stranding in the left cheek. 3. No acute cervical spine fracture or traumatic listhesis. 4. Right maxillary lateral incisor and left mandibular second bicuspid dental caries with periapical abscesses. Electronically Signed   By: Obie DredgeWilliam T Derry M.D.   On: 03/09/2021 13:02    ____________________________________________   PROCEDURES and INTERVENTIONS  Procedure(s) performed (including Critical Care):  Marland Kitchen.Marland Kitchen.Laceration Repair  Date/Time: 03/09/2021 2:30 PM Performed by: Delton PrairieSmith, Brevin Mcfadden, MD Authorized by: Delton PrairieSmith, Landyn Buckalew, MD   Consent:    Consent obtained:  Verbal   Consent given by:  Patient   Risks, benefits, and alternatives were discussed: yes     Risks discussed:  Infection, pain, poor wound healing and poor cosmetic result   Alternatives discussed:  No treatment Universal protocol:    Patient identity confirmed:  Verbally with patient Anesthesia:    Anesthesia method:  Topical application   Topical anesthetic:  LET Laceration details:    Location:  Lip   Lip location:  Upper interior lip   Length (cm):  2 Exploration:    Limited defect created (wound extended): no     Hemostasis achieved with:  Direct pressure   Imaging obtained comment:  CT    Imaging outcome: foreign body not noted     Wound exploration: wound explored through full range of motion     Contaminated: no   Treatment:  Amount of cleaning:  Standard   Irrigation solution:  Sterile saline   Visualized foreign bodies/material removed: no     Debridement:  None   Undermining:  None   Scar revision: no   Skin repair:    Repair method:  Sutures   Suture size:  5-0   Wound skin closure material used: Monocryl.   Suture technique:  Simple interrupted   Number of sutures:  2 Approximation:    Approximation:  Close   Vermilion border well-aligned: yes   Repair type:    Repair type:  Simple Post-procedure details:    Dressing:  Open (no dressing)   Procedure completion:  Tolerated well, no immediate complications    Medications  lidocaine-EPINEPHrine-tetracaine (LET) topical gel (3 mLs Topical Given 03/09/21 1345)  acetaminophen (TYLENOL) tablet 1,000 mg (1,000 mg Oral Given 03/09/21 1344)  naproxen (NAPROSYN) tablet 500 mg (500 mg Oral Given 03/09/21 1345)    ____________________________________________   MDM / ED COURSE   40 year old woman without significant medical history presents to the ED after work-related facial injury causing a lip laceration, requiring bedside repair with a couple sutures, and ultimately amenable to outpatient management.  Normal vitals on room air.  Exam with superficial abrasion to her left maxillary face without evidence of open injury, bony step-offs or EOM entrapment.  No significant nasal trauma or signs of nasal septal hematoma.  No loose teeth or signs of periapical abscess that I can drain.  She does have mucosal laceration to her left upper lip that is X-shaped and requires bedside repair with 2 absorbable sutures, as above.  We discussed CT findings and I urged the patient to pick up the course of antibiotics that was prescribed to her about 2 weeks ago by her dentist.  We discussed return precautions for the ED and patient  is stable for outpatient management.   Clinical Course as of 03/09/21 1429  Tue Mar 09, 2021  1337 Discussed the patient my recommendation for let cream and single suture on mucosal surface of her upper lip due to gaping laceration.  She is agreeable. [DS]  1418 2 sutures placed, was tolerated.  We discussed again the importance of picking up her antibiotics prescribed by her dentist.  We discussed outpatient management and return precautions for the ED. [DS]    Clinical Course User Index [DS] Delton Prairie, MD    ____________________________________________   FINAL CLINICAL IMPRESSION(S) / ED DIAGNOSES  Final diagnoses:  Lip laceration, initial encounter  Work related injury  Facial injury, initial encounter     ED Discharge Orders    None       Winslow Ederer Katrinka Blazing   Note:  This document was prepared using Dragon voice recognition software and may include unintentional dictation errors.   Delton Prairie, MD 03/09/21 [phone]

## 2021-03-12 ENCOUNTER — Other Ambulatory Visit: Payer: Self-pay | Admitting: Physician Assistant

## 2021-04-22 IMAGING — CT CT MAXILLOFACIAL W/O CM
3 series · 15 of 47 positions shown, 18 images · non-contrast
Comparison: Cervical spine x-rays dated March 05, 2018. CT head
dated November 02, 2017.

CLINICAL DATA: Hit left face on metal while driving a pallet [place]
at work.

EXAM:
CT HEAD WITHOUT CONTRAST
CT MAXILLOFACIAL WITHOUT CONTRAST
CT CERVICAL SPINE WITHOUT CONTRAST
TECHNIQUE: Multidetector CT imaging of the head, cervical spine, and
maxillofacial structures were performed using the standard protocol
without intravenous contrast. Multiplanar CT image reconstructions
of the cervical spine and maxillofacial structures were also
generated.

[Series 2: max soft (person_name) · axial · 0.34mm/px · z∈[+358,+502]mm · 9 of 84 slices shown, 12 images]
[im 6/84  brain]
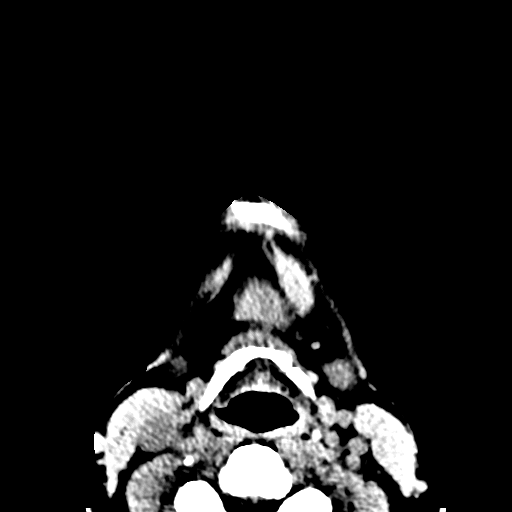
[im 6/84  bone]
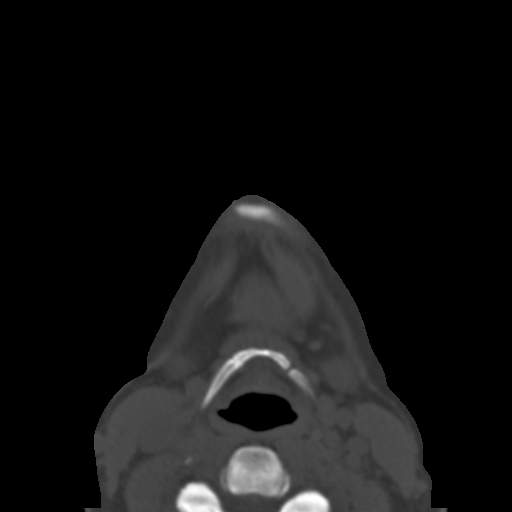
[im 15/84  bone]
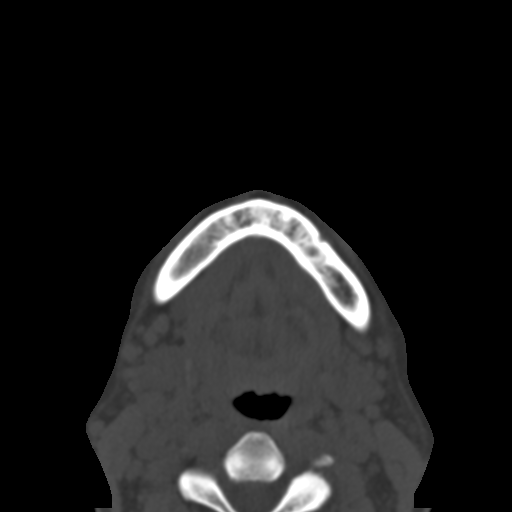
[im 23/84  bone]
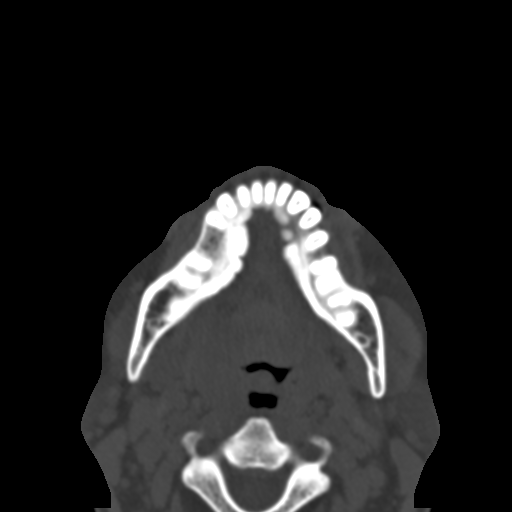
[im 32/84  bone]
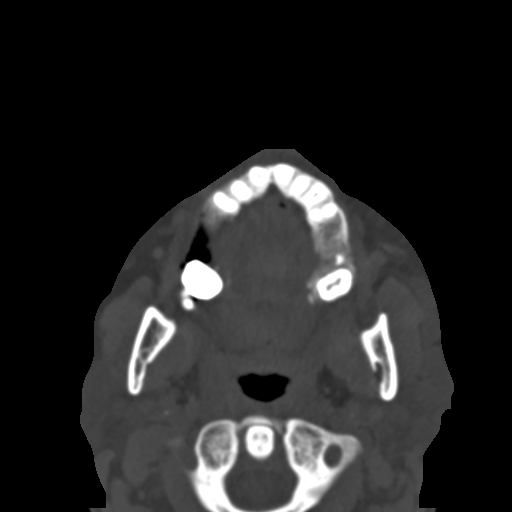
[im 43/84  brain]
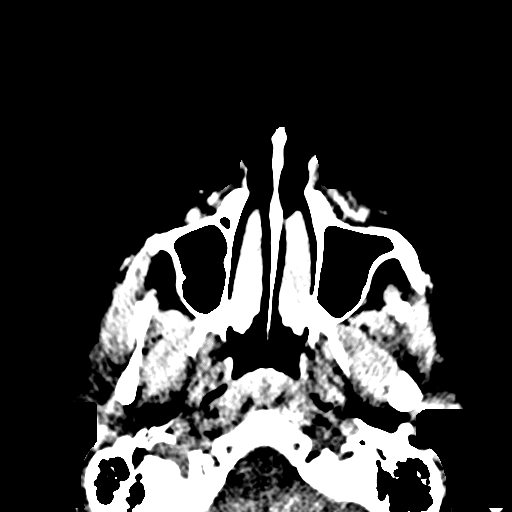
[im 43/84  bone]
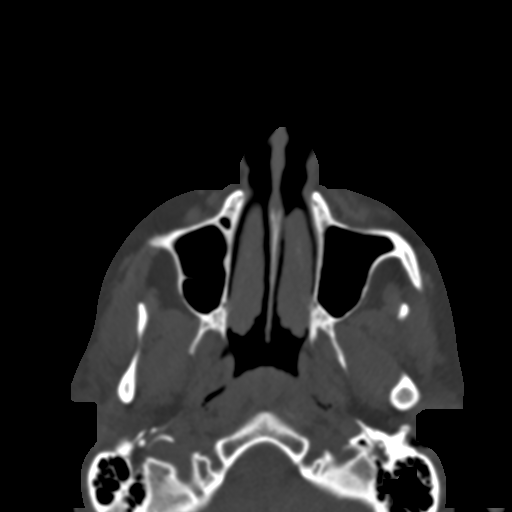
[im 52/84  bone]
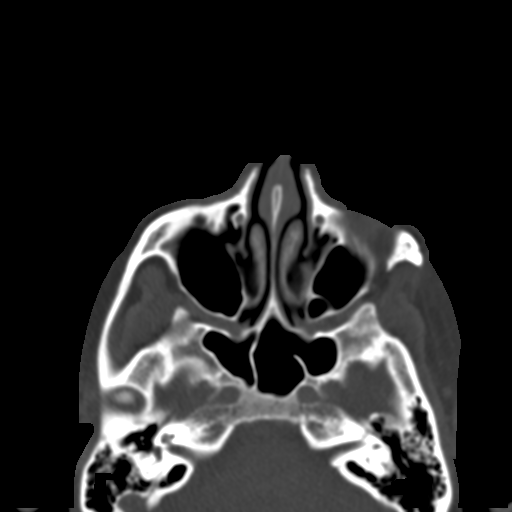
[im 61/84  bone]
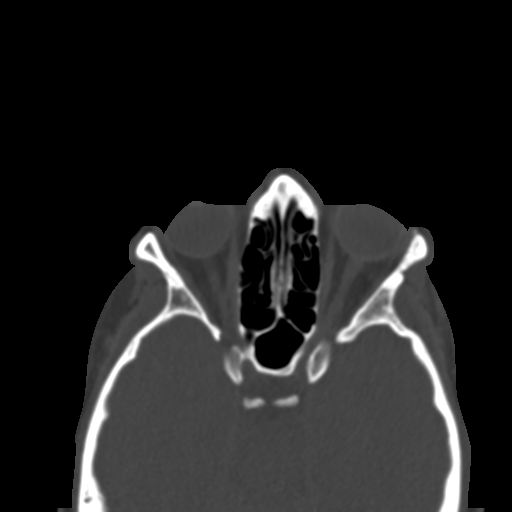
[im 69/84  bone]
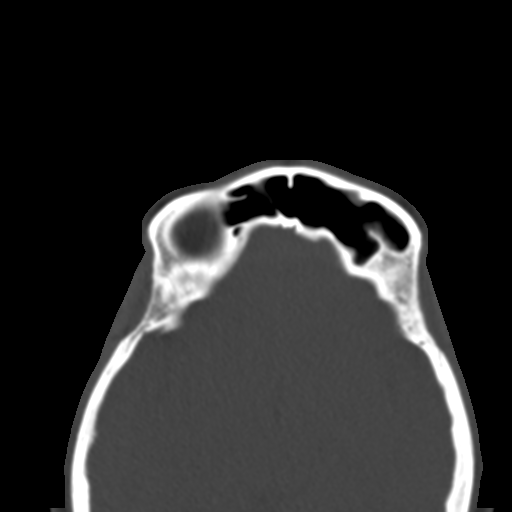
[im 78/84  brain]
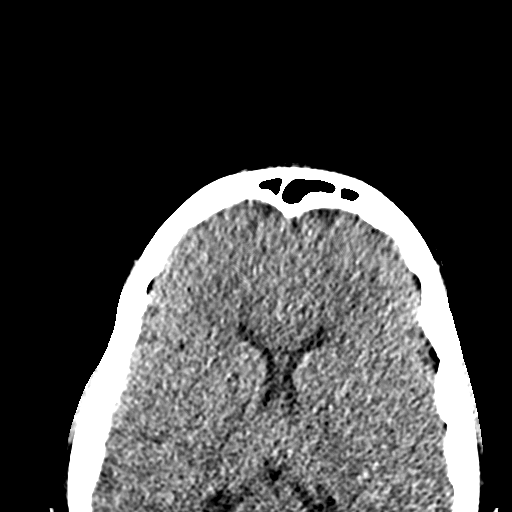
[im 78/84  bone]
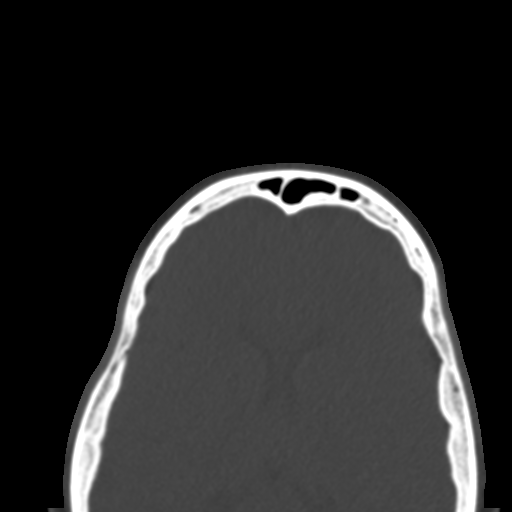

[Series 6: coronal soft · coronal · 0.34mm/px · 3 of 90 slices shown]
[im 30/90  bone]
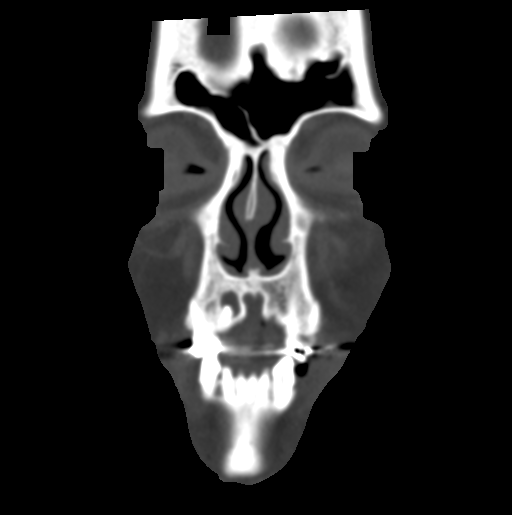
[im 40/90  bone]
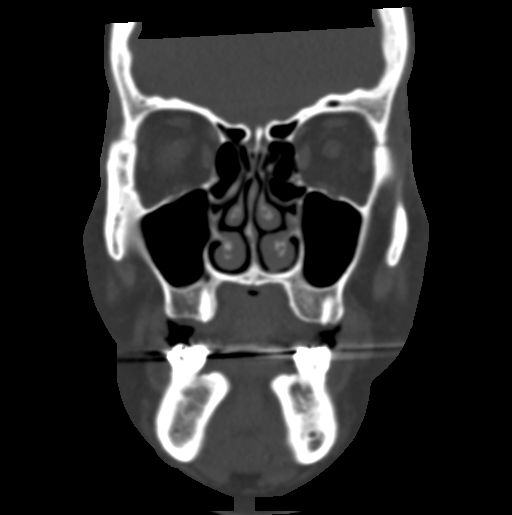
[im 50/90  bone]
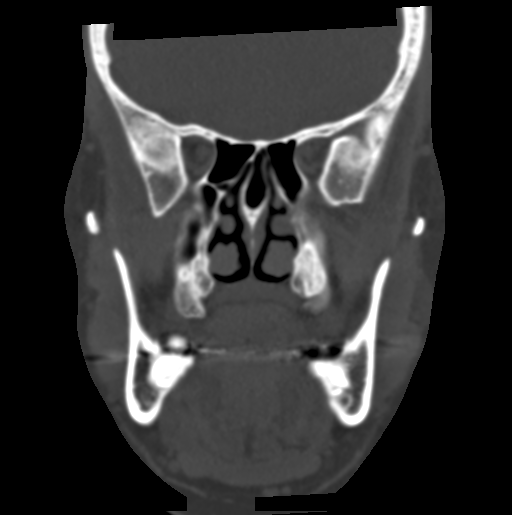

[Series 7: sagittal soft · sagittal · 0.35mm/px · 3 of 78 slices shown]
[im 26/78  bone]
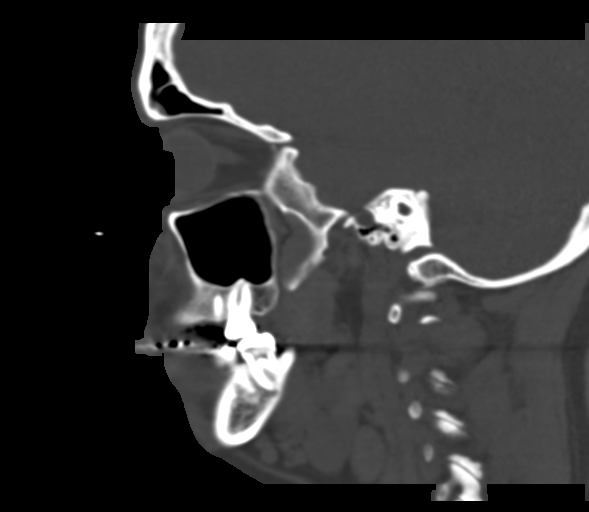
[im 39/78  bone]
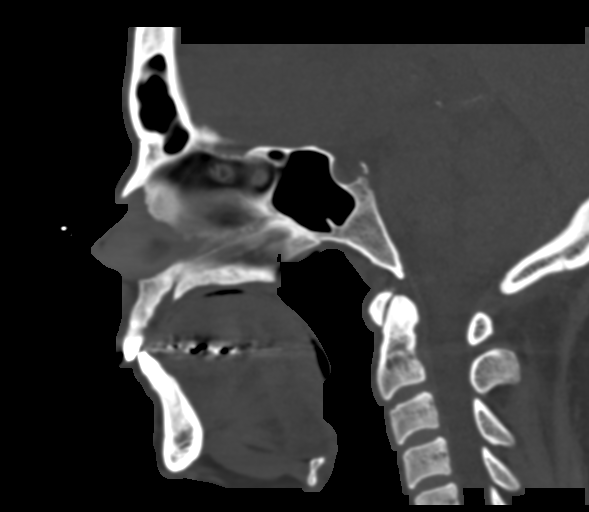
[im 52/78  bone]
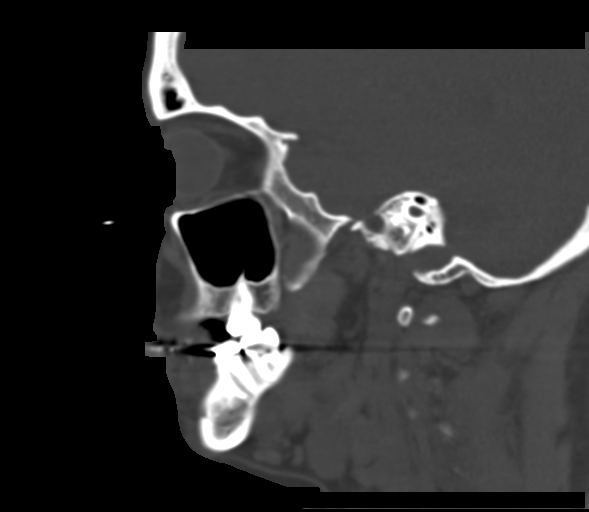

[15 of 47 positions shown; findings below may reference images not displayed]

FINDINGS: CT HEAD FINDINGS

Brain: No evidence of acute infarction, hemorrhage, hydrocephalus,
extra-axial collection or mass lesion/mass effect.

Vascular: No hyperdense vessel or unexpected calcification.

Skull: Normal. Negative for fracture or focal lesion.

Other: None.

CT MAXILLOFACIAL FINDINGS

Osseous: No fracture or mandibular dislocation. Right maxillary
lateral incisor and left mandibular second bicuspid dental caries
with periapical abscesses.

Orbits: Negative. No traumatic or inflammatory finding.

Sinuses: Clear.

Soft tissues: Mild fat stranding in the left cheek.

CT CERVICAL SPINE FINDINGS

Alignment: No traumatic malalignment. Straightening of the normal
lumbar lordosis. Trace degenerative retrolisthesis at C6-C7.

Skull base and vertebrae: No acute fracture. No primary bone lesion
or focal pathologic process.

Soft tissues and spinal canal: No prevertebral fluid or swelling. No
visible canal hematoma.

Disc levels: Mild disc height loss and circumferential disc
osteophyte complexes at C5-C6 and C6-C7. Mild C5-C6 and moderate
C6-C7 uncovertebral hypertrophy. Mild spinal canal and
neuroforaminal stenosis at C6-C7.

Upper chest: Negative.

Other: None.
IMPRESSION: 1. No acute intracranial abnormality.
2. No acute maxillofacial fracture. Mild fat stranding in the left
cheek.
3. No acute cervical spine fracture or traumatic listhesis.
4. Right maxillary lateral incisor and left mandibular second
bicuspid dental caries with periapical abscesses.

## 2021-04-22 IMAGING — CT CT CERVICAL SPINE W/O CM
3 of 4 series · 12 of 33 positions shown, 14 images · non-contrast
Comparison: Cervical spine x-rays dated March 05, 2018. CT head
dated November 02, 2017.

CLINICAL DATA: Hit left face on metal while driving a pallet [place]
at work.

EXAM:
CT HEAD WITHOUT CONTRAST
CT MAXILLOFACIAL WITHOUT CONTRAST
CT CERVICAL SPINE WITHOUT CONTRAST
TECHNIQUE: Multidetector CT imaging of the head, cervical spine, and
maxillofacial structures were performed using the standard protocol
without intravenous contrast. Multiplanar CT image reconstructions
of the cervical spine and maxillofacial structures were also
generated.

[Series 4: sagittal bone · sagittal · 0.29mm/px · 5 of 60 slices shown, 6 images]
[im 20/60  bone]
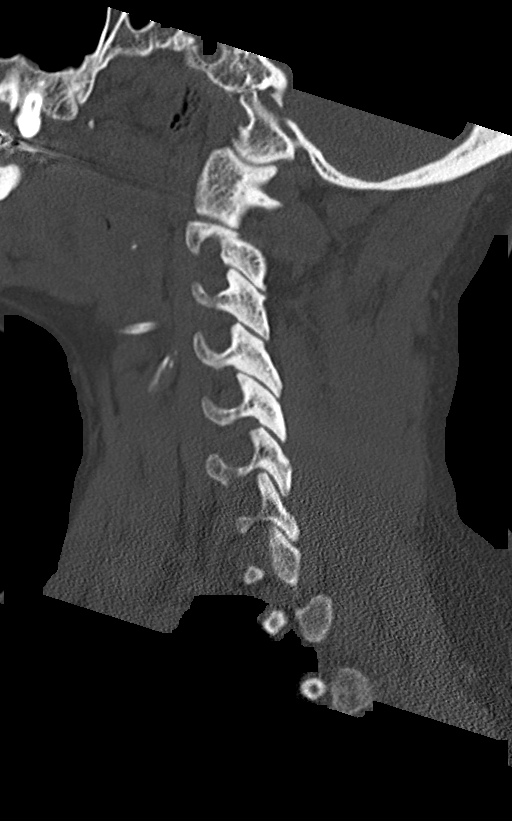
[im 25/60  bone]
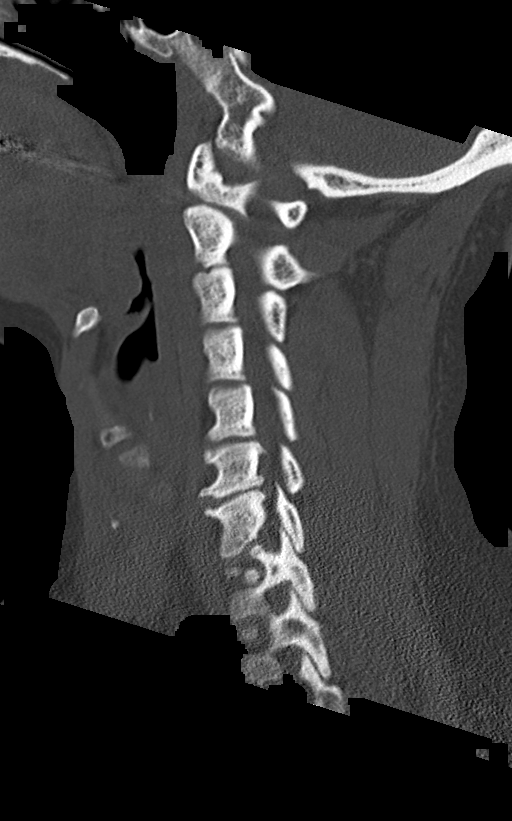
[im 30/60  soft-tissue]
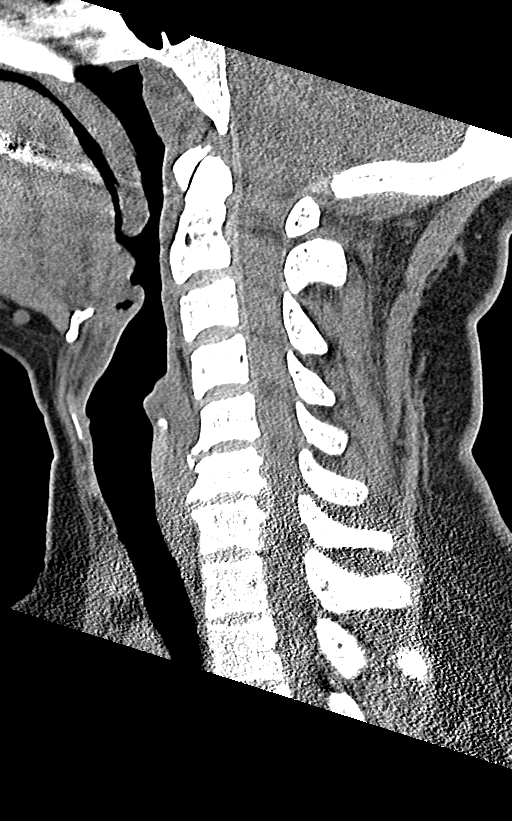
[im 30/60  bone]
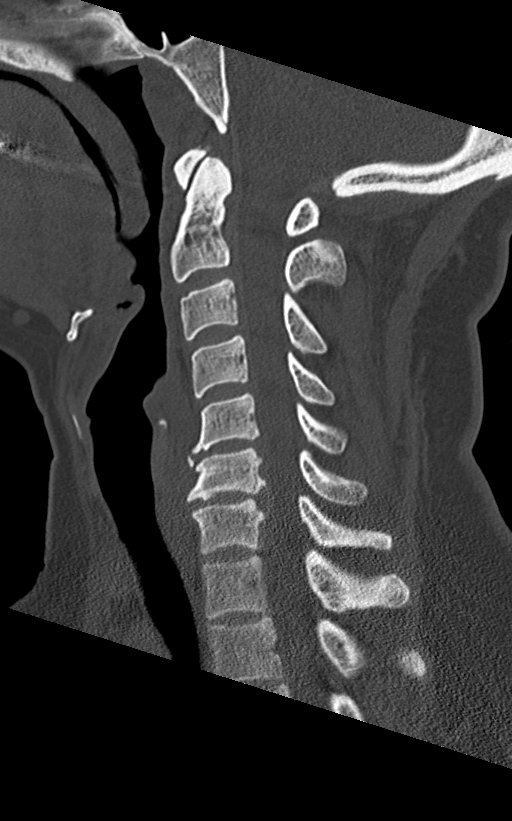
[im 35/60  bone]
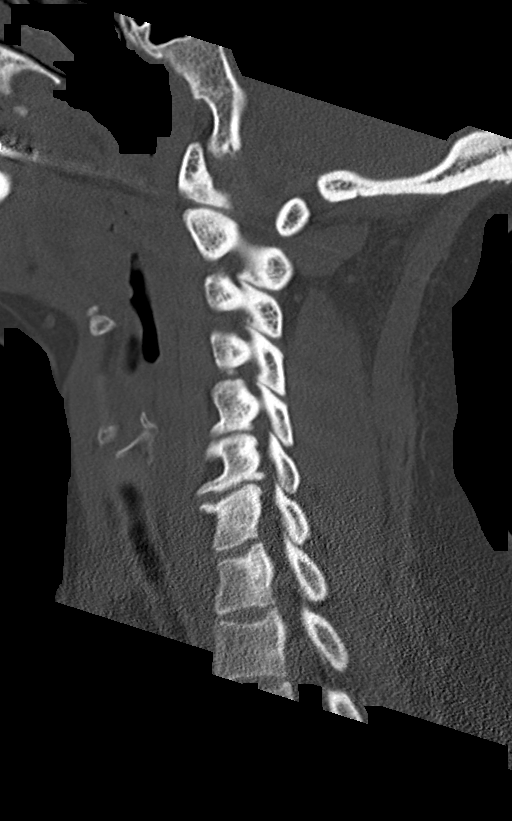
[im 40/60  bone]
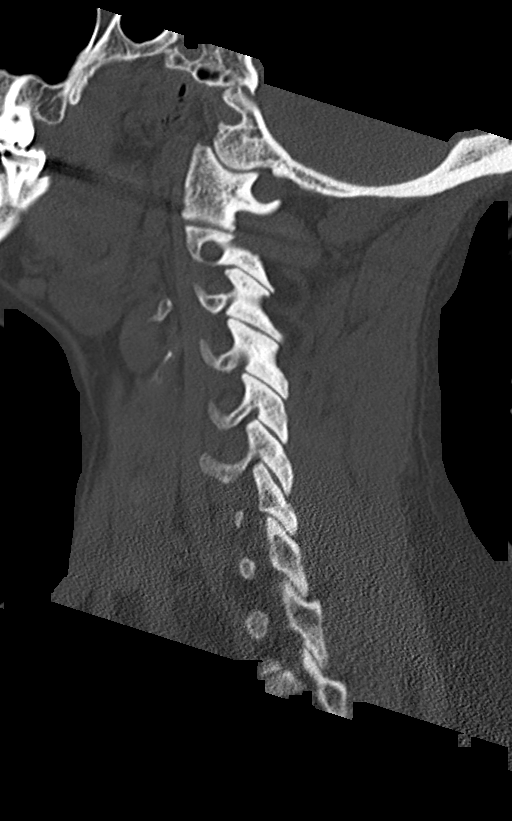

[Series 5: coronal bone · coronal · 0.33mm/px · 3 of 59 slices shown]
[im 12/59  bone]
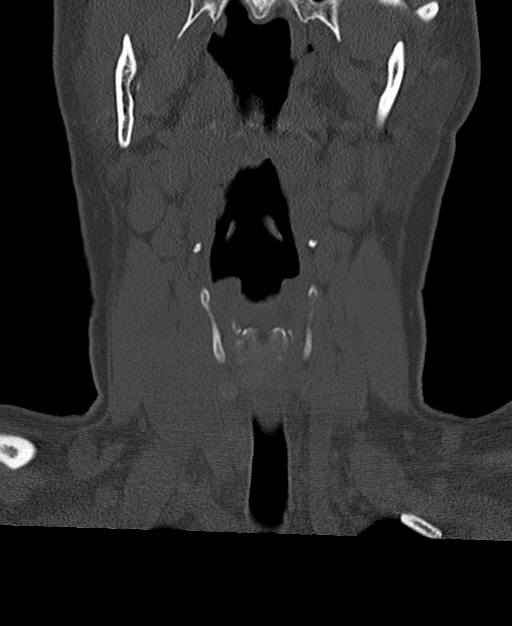
[im 24/59  bone]
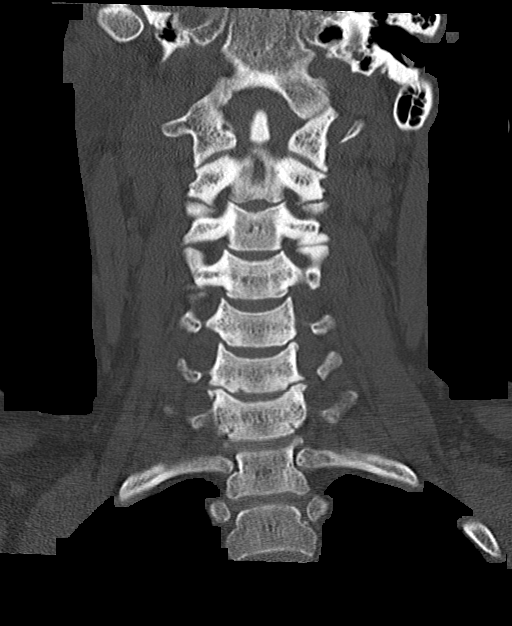
[im 35/59  bone]
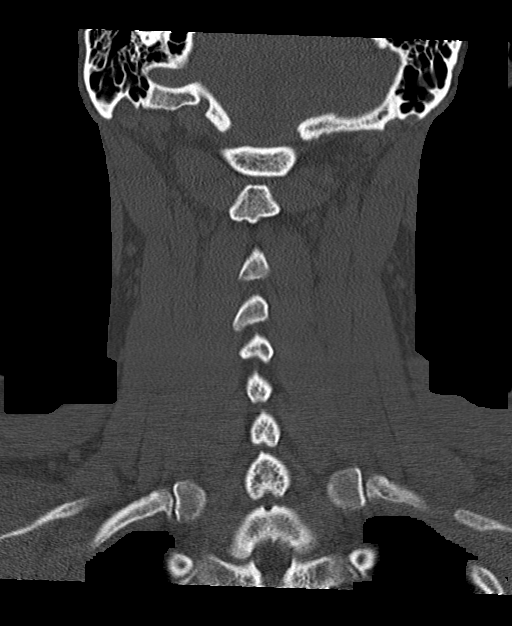

[Series 6: orthogonal bone · axial · 0.29mm/px · z∈[+313,+434]mm · 4 of 98 slices shown, 5 images]
[im 17/98  soft-tissue]
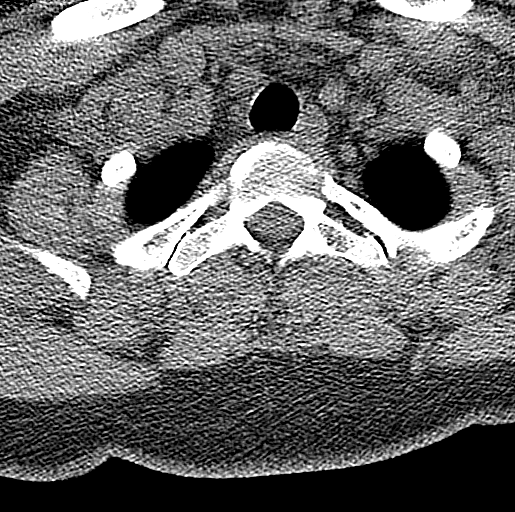
[im 17/98  bone]
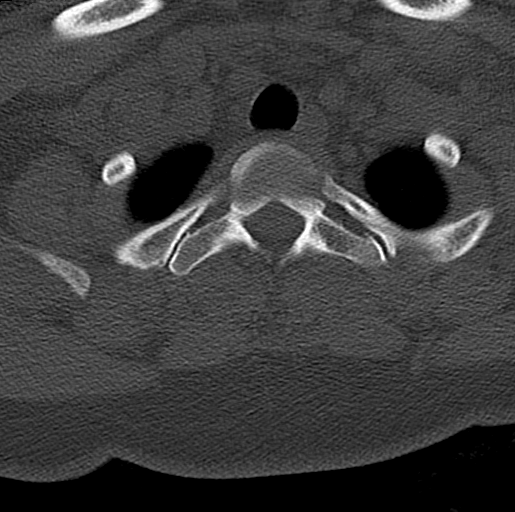
[im 33/98  bone]
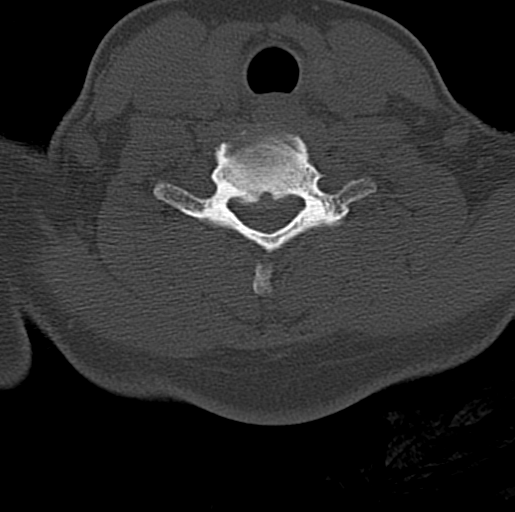
[im 65/98  bone]
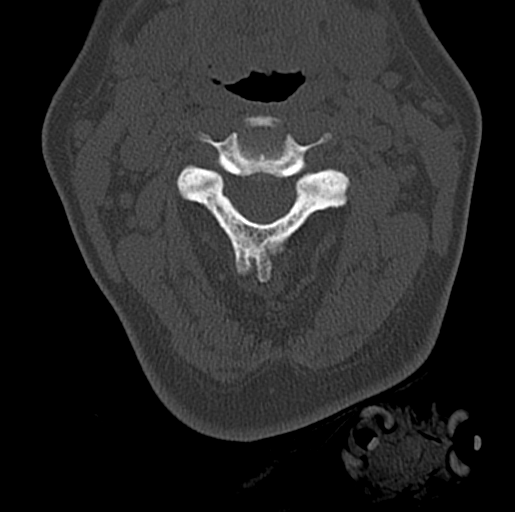
[im 81/98  bone]
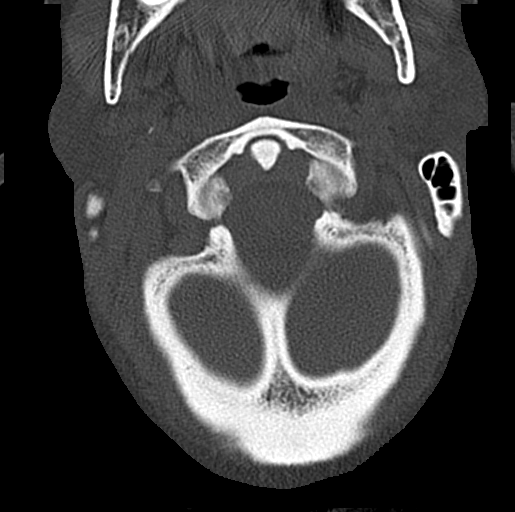

[12 of 33 positions shown; findings below may reference images not displayed]

FINDINGS: CT HEAD FINDINGS

Brain: No evidence of acute infarction, hemorrhage, hydrocephalus,
extra-axial collection or mass lesion/mass effect.

Vascular: No hyperdense vessel or unexpected calcification.

Skull: Normal. Negative for fracture or focal lesion.

Other: None.

CT MAXILLOFACIAL FINDINGS

Osseous: No fracture or mandibular dislocation. Right maxillary
lateral incisor and left mandibular second bicuspid dental caries
with periapical abscesses.

Orbits: Negative. No traumatic or inflammatory finding.

Sinuses: Clear.

Soft tissues: Mild fat stranding in the left cheek.

CT CERVICAL SPINE FINDINGS

Alignment: No traumatic malalignment. Straightening of the normal
lumbar lordosis. Trace degenerative retrolisthesis at C6-C7.

Skull base and vertebrae: No acute fracture. No primary bone lesion
or focal pathologic process.

Soft tissues and spinal canal: No prevertebral fluid or swelling. No
visible canal hematoma.

Disc levels: Mild disc height loss and circumferential disc
osteophyte complexes at C5-C6 and C6-C7. Mild C5-C6 and moderate
C6-C7 uncovertebral hypertrophy. Mild spinal canal and
neuroforaminal stenosis at C6-C7.

Upper chest: Negative.

Other: None.
IMPRESSION: 1. No acute intracranial abnormality.
2. No acute maxillofacial fracture. Mild fat stranding in the left
cheek.
3. No acute cervical spine fracture or traumatic listhesis.
4. Right maxillary lateral incisor and left mandibular second
bicuspid dental caries with periapical abscesses.

## 2022-05-09 ENCOUNTER — Ambulatory Visit
Admission: EM | Admit: 2022-05-09 | Discharge: 2022-05-09 | Disposition: A | Discharge status: Home / Self Care | Payer: BC Managed Care – PPO

## 2022-05-09 DIAGNOSIS — M5412 Radiculopathy, cervical region: Secondary | ICD-10-CM | POA: Diagnosis not present

## 2022-05-09 HISTORY — DX: Other psychoactive substance abuse, uncomplicated: F19.10

## 2022-05-09 MED ORDER — KETOROLAC TROMETHAMINE 60 MG/2ML IM SOLN
60.0000 mg | Freq: Once | INTRAMUSCULAR | Status: AC
  Administered 2022-05-09: 60 mg via INTRAMUSCULAR

## 2022-05-09 MED ORDER — GABAPENTIN 300 MG PO CAPS: 300.0000 mg | ORAL_CAPSULE | Freq: Every evening | ORAL | 0 refills | Status: AC | PRN

## 2022-05-09 MED ORDER — PREDNISONE 10 MG PO TABS: ORAL_TABLET | ORAL | 0 refills | Status: DC

## 2022-05-09 MED ORDER — BACLOFEN 10 MG PO TABS: 10.0000 mg | ORAL_TABLET | Freq: Three times a day (TID) | ORAL | 0 refills | Status: AC | PRN

## 2022-05-09 NOTE — Discharge Instructions (Addendum)
NECK PAIN: Stressed avoiding painful activities. This can exacerbate your symptoms and make them worse.  May apply heat to the areas of pain for some relief. Use medications as directed. Be aware of which medications make you drowsy and do not drive or operate any kind of heavy machinery while using the medication (ie pain medications or muscle relaxers). F/U with PCP for reexamination or return sooner if condition worsens or does not begin to improve over the next few days.   NECK PAIN RED FLAGS: If symptoms get worse than they are right now, you should come back sooner for re-evaluation. If you have increased numbness/ tingling or notice that the numbness/tingling is affecting the legs or saddle region, go to ER. If you ever lose continence go to ER.     

## 2022-05-09 NOTE — ED Triage Notes (Signed)
Pt states she was using equipment at work on Friday and using her right hand/arm in a repetitive motion. Woke up Saturday with right upper back/neck/shoulder pain and going down right arm. Fingertips going numb

## 2022-05-09 NOTE — ED Provider Notes (Signed)
[facility]    CSN: [id number] Arrival date & time: 05/09/22  7673      History   Chief Complaint Chief Complaint  Patient presents with   Neck Pain    HPI Melanie Howell is a 41 y.o. female presenting for right forearm pain as well as right posterior scapular pain and right neck pain for the past 3 days.  Patient reports she gradually noticed the pain.  She also reports tingling and numbness in her thumb, index finger and middle finger tips.  Has taken over-the-counter pain medications without improvement in symptoms.  Patient reports similar symptoms about a year ago.  States she took prednisone and a muscle relaxer and it got better within a week.  She states that she has had difficulty performing her job today so her employer sent her here to be evaluated.  Increased pain in her neck when she rotates it to the right.  Some relief of neck pain with forward flexion but increased pain with extension.  Some relief of arm pain when she raises it above her shoulder and rests it on her head.  States that so she has been sleeping at night but has been having a lot of difficulty sleeping at night due to the pain.  Patient has history of substance abuse and receives Suboxone.  HPI  Past Medical History:  Diagnosis Date   Substance abuse (HCC)     There are no problems to display for this patient.   Past Surgical History:  Procedure Laterality Date   APPENDECTOMY      OB History   No obstetric history on file.      Home Medications    Prior to Admission medications   Medication Sig Start Date End Date Taking? Authorizing Provider  baclofen (LIORESAL) 10 MG tablet Take 1 tablet (10 mg total) by mouth 3 (three) times daily as needed for muscle spasms. 05/09/22  Yes Eusebio Friendly B, PA-C  gabapentin (NEURONTIN) 300 MG capsule Take 1 capsule (300 mg total) by mouth at bedtime as needed for up to 10 days (pain). 05/09/22 05/19/22 Yes Shirlee Latch, PA-C  predniSONE  (DELTASONE) 10 MG tablet Take 6 tabs p.o. on day 1 and decrease by 1 tablet daily until complete 05/09/22  Yes Eusebio Friendly B, PA-C  buprenorphine-naloxone (SUBOXONE) 8-2 mg SUBL SL tablet Place 1 tablet under the tongue 2 (two) times daily. 04/26/22   [provider]  Calcium Carb-Cholecalciferol (OYSTER SHELL CALCIUM W/D) 500-5 MG-MCG TABS Take by mouth.    [provider]  DEPO-PROVERA 150 MG/ML injection Inject 150 mg into the muscle every 3 (three) months. 03/23/22   [provider]    Family History History reviewed. No pertinent family history.  Social History Social History   Tobacco Use   Smoking status: Former    Types: Cigarettes   Smokeless tobacco: Never  Vaping Use   Vaping Use: Some days  Substance Use Topics   Alcohol use: No   Drug use: Not Currently     Allergies   Penicillins   Review of Systems Review of Systems  Respiratory:  Negative for shortness of breath.   Cardiovascular:  Negative for chest pain.  Musculoskeletal:  Positive for arthralgias, neck pain and neck stiffness. Negative for joint swelling.  Neurological:  Positive for numbness. Negative for weakness.     Physical Exam Triage Vital Signs ED Triage Vitals  Enc Vitals Group     BP 05/09/22 0949 134/89  Pulse Rate 05/09/22 0949 87     Resp 05/09/22 0949 17     Temp 05/09/22 0949 99 F (37.2 C)     Temp Source 05/09/22 0949 Oral     SpO2 05/09/22 0949 100 %     Weight 05/09/22 0946 172 lb (78 kg)     Height 05/09/22 0946 5\' 6"  (1.676 m)     Head Circumference --      Peak Flow --      Pain Score 05/09/22 0945 6     Pain Loc --      Pain Edu? --      Excl. in GC? --    No data found.  Updated Vital Signs BP 134/89 (BP Location: Left Arm)   Pulse 87   Temp 99 F (37.2 C) (Oral)   Resp 17   Ht 5\' 6"  (1.676 m)   Wt 172 lb (78 kg)   SpO2 100%   BMI 27.76 kg/m      Physical Exam Vitals and nursing note reviewed.  Constitutional:      General:  She is not in acute distress.    Appearance: Normal appearance. She is not ill-appearing or toxic-appearing.  HENT:     Head: Normocephalic and atraumatic.  Eyes:     General: No scleral icterus.       Right eye: No discharge.        Left eye: No discharge.     Conjunctiva/sclera: Conjunctivae normal.  Cardiovascular:     Rate and Rhythm: Normal rate and regular rhythm.     Heart sounds: Normal heart sounds.  Pulmonary:     Effort: Pulmonary effort is normal. No respiratory distress.     Breath sounds: Normal breath sounds.  Musculoskeletal:     Cervical back: Neck supple. Pain with movement (extension, rotation to right) and muscular tenderness (right paracervical muscles) present. Normal range of motion.     Comments: Patient has tenderness to palpation of the right posterior scapular region.  Full range of motion of her arm without any tenderness palpation of any aspect of her arm.  Skin:    General: Skin is dry.  Neurological:     General: No focal deficit present.     Mental Status: She is alert. Mental status is at baseline.     Motor: No weakness.     Gait: Gait normal.  Psychiatric:        Mood and Affect: Mood normal.        Behavior: Behavior normal.        Thought Content: Thought content normal.      UC Treatments / Results  Labs (all labs ordered are listed, but only abnormal results are displayed) Labs Reviewed - No data to display  EKG   Radiology No results found.  Procedures Procedures (including critical care time)  Medications Ordered in UC Medications  ketorolac (TORADOL) injection 60 mg (60 mg Intramuscular Given 05/09/22 1027)    Initial Impression / Assessment and Plan / UC Course  I have reviewed the triage vital signs and the nursing notes.  Pertinent labs & imaging results that were available during my care of the patient were reviewed by me and considered in my medical decision making (see chart for details).  41 year old female  presenting for right-sided neck pain with radiation down the arm to the forearm.  Also reports associated numbness and tingling to the tips of her thumb, index finger and middle finger.  Symptom  onset about 3 days ago.  Patient reports similar symptoms in the past when she had a pinched nerve.  Has been taking OTC meds without improvement in symptoms.  Vitals normal stable.  Patient overall well-appearing but does appear to be in discomfort.  Tenderness to palpation of the right paracervical muscles and right posterior scapular muscles.  Full range of motion of neck and arms.  Increased pain with extension of neck and rotation to the right.  Relief of arm pain with raising right arm above shoulder and resting on head.  Good strength of extremities.  Suspect cervical radiculopathy, possible disc herniation.  We will treat at this time with prednisone and baclofen.  Also gave gabapentin to be taken at nighttime over the next 7 to 10 days as needed for pain relief.  Advised to continue her home medications.  Work note given for the next 3 days.  Advised patient if she feels that she cannot return to work and needs more time she would have to be reevaluated.  Return and ER precautions reviewed relating to neck pain and disc herniations.   Final Clinical Impressions(s) / UC Diagnoses   Final diagnoses:  Cervical radiculopathy     Discharge Instructions      NECK PAIN: Stressed avoiding painful activities. This can exacerbate your symptoms and make them worse.  May apply heat to the areas of pain for some relief. Use medications as directed. Be aware of which medications make you drowsy and do not drive or operate any kind of heavy machinery while using the medication (ie pain medications or muscle relaxers). F/U with PCP for reexamination or return sooner if condition worsens or does not begin to improve over the next few days.   NECK PAIN RED FLAGS: If symptoms get worse than they are right now, you  should come back sooner for re-evaluation. If you have increased numbness/ tingling or notice that the numbness/tingling is affecting the legs or saddle region, go to ER. If you ever lose continence go to ER.         ED Prescriptions     Medication Sig Dispense Auth. Provider   predniSONE (DELTASONE) 10 MG tablet Take 6 tabs p.o. on day 1 and decrease by 1 tablet daily until complete 21 tablet Eusebio Friendly B, PA-C   baclofen (LIORESAL) 10 MG tablet Take 1 tablet (10 mg total) by mouth 3 (three) times daily as needed for muscle spasms. 30 each Eusebio Friendly B, PA-C   gabapentin (NEURONTIN) 300 MG capsule Take 1 capsule (300 mg total) by mouth at bedtime as needed for up to 10 days (pain). 10 capsule Shirlee Latch, PA-C      I have reviewed the PDMP during this encounter.   Shirlee Latch, PA-C 05/09/22 1032

## 2022-10-03 ENCOUNTER — Other Ambulatory Visit: Payer: Self-pay | Admitting: Family Medicine

## 2022-10-03 DIAGNOSIS — Z1231 Encounter for screening mammogram for malignant neoplasm of breast: Secondary | ICD-10-CM

## 2023-06-08 ENCOUNTER — Other Ambulatory Visit: Payer: Self-pay | Admitting: Family Medicine

## 2023-06-08 DIAGNOSIS — E041 Nontoxic single thyroid nodule: Secondary | ICD-10-CM

## 2023-06-08 DIAGNOSIS — Z1231 Encounter for screening mammogram for malignant neoplasm of breast: Secondary | ICD-10-CM

## 2023-09-30 ENCOUNTER — Ambulatory Visit
Admission: EM | Admit: 2023-09-30 | Discharge: 2023-09-30 | Disposition: A | Discharge status: Home / Self Care | Payer: Medicaid Other | Attending: Physician Assistant | Admitting: Physician Assistant

## 2023-09-30 ENCOUNTER — Ambulatory Visit: Payer: Medicaid Other

## 2023-09-30 DIAGNOSIS — R509 Fever, unspecified: Secondary | ICD-10-CM

## 2023-09-30 DIAGNOSIS — R062 Wheezing: Secondary | ICD-10-CM | POA: Diagnosis not present

## 2023-09-30 DIAGNOSIS — J22 Unspecified acute lower respiratory infection: Secondary | ICD-10-CM | POA: Diagnosis not present

## 2023-09-30 DIAGNOSIS — R051 Acute cough: Secondary | ICD-10-CM

## 2023-09-30 DIAGNOSIS — R0602 Shortness of breath: Secondary | ICD-10-CM

## 2023-09-30 MED ORDER — ALBUTEROL SULFATE (2.5 MG/3ML) 0.083% IN NEBU
2.5000 mg | INHALATION_SOLUTION | Freq: Once | RESPIRATORY_TRACT | Status: AC
  Administered 2023-09-30: 2.5 mg via RESPIRATORY_TRACT

## 2023-09-30 MED ORDER — PREDNISONE 20 MG PO TABS: 40.0000 mg | ORAL_TABLET | Freq: Every day | ORAL | 0 refills | Status: AC

## 2023-09-30 MED ORDER — ALBUTEROL SULFATE HFA 108 (90 BASE) MCG/ACT IN AERS: 1.0000 | INHALATION_SPRAY | Freq: Four times a day (QID) | RESPIRATORY_TRACT | 0 refills | Status: AC | PRN

## 2023-09-30 MED ORDER — DOXYCYCLINE HYCLATE 100 MG PO CAPS: 100.0000 mg | ORAL_CAPSULE | Freq: Two times a day (BID) | ORAL | 0 refills | Status: AC

## 2023-09-30 MED ORDER — PROMETHAZINE-DM 6.25-15 MG/5ML PO SYRP: 5.0000 mL | ORAL_SOLUTION | Freq: Four times a day (QID) | ORAL | 0 refills | Status: AC | PRN

## 2023-09-30 NOTE — ED Provider Notes (Signed)
[facility]    CSN: [id number] Arrival date & time: 09/30/23  1429      History   Chief Complaint Chief Complaint  Patient presents with   Generalized Body Aches   Cough    HPI Melanie Howell is a 42 y.o. female presenting for approximately 1 week history of illness.  Reports temps up to 101.7 degrees with bodyaches, congestion and cough.  Reports shortness of breath and difficulty breathing out. Denies sore throat, sinus pain, chest pain, wheezing, abdominal pain, vomiting.  Family has been sick as well.  Has been taking Tylenol and Mucinex.  HPI  Past Medical History:  Diagnosis Date   Substance abuse (HCC)     There are no problems to display for this patient.   Past Surgical History:  Procedure Laterality Date   APPENDECTOMY      OB History   No obstetric history on file.      Home Medications    Prior to Admission medications   Medication Sig Start Date End Date Taking? Authorizing Provider  baclofen (LIORESAL) 10 MG tablet Take 1 tablet (10 mg total) by mouth 3 (three) times daily as needed for muscle spasms. 05/09/22  Yes Eusebio Friendly B, PA-C  buprenorphine-naloxone (SUBOXONE) 8-2 mg SUBL SL tablet Place 1 tablet under the tongue 2 (two) times daily. 04/26/22  Yes [provider]  DEPO-PROVERA 150 MG/ML injection Inject 150 mg into the muscle every 3 (three) months. 03/23/22  Yes [provider]  predniSONE (DELTASONE) 10 MG tablet Take 6 tabs p.o. on day 1 and decrease by 1 tablet daily until complete 05/09/22  Yes Eusebio Friendly B, PA-C  Calcium Carb-Cholecalciferol (OYSTER SHELL CALCIUM W/D) 500-5 MG-MCG TABS Take by mouth.    [provider]  gabapentin (NEURONTIN) 300 MG capsule Take 1 capsule (300 mg total) by mouth at bedtime as needed for up to 10 days (pain). 05/09/22 05/19/22  Shirlee Latch, PA-C    Family History History reviewed. No pertinent family history.  Social History Social History   Tobacco Use    Smoking status: Former    Types: Cigarettes   Smokeless tobacco: Never  Vaping Use   Vaping status: Some Days  Substance Use Topics   Alcohol use: No   Drug use: Not Currently     Allergies   Penicillins   Review of Systems Review of Systems  Constitutional:  Positive for fatigue and fever. Negative for chills and diaphoresis.  HENT:  Positive for congestion, rhinorrhea and sinus pressure. Negative for ear pain, sinus pain and sore throat.   Respiratory:  Positive for cough, chest tightness and shortness of breath.   Cardiovascular:  Negative for chest pain.  Gastrointestinal:  Negative for abdominal pain, nausea and vomiting.  Musculoskeletal:  Positive for myalgias.  Skin:  Negative for rash.  Neurological:  Negative for weakness and headaches.  Hematological:  Negative for adenopathy.     Physical Exam Triage Vital Signs ED Triage Vitals  Encounter Vitals Group     BP 09/30/23 1454 121/79     Systolic BP Percentile --      Diastolic BP Percentile --      Pulse Rate 09/30/23 1454 92     Resp --      Temp 09/30/23 1454 98.5 F (36.9 C)     Temp Source 09/30/23 1454 Oral     SpO2 09/30/23 1454 95 %     Weight 09/30/23 1451 180 lb (81.6 kg)  Height 09/30/23 1451 5\' 6"  (1.676 m)     Head Circumference --      Peak Flow --      Pain Score 09/30/23 1451 0     Pain Loc --      Pain Education --      Exclude from Growth Chart --    No data found.  Updated Vital Signs BP 121/79 (BP Location: Left Arm)   Pulse 92   Temp 98.5 F (36.9 C) (Oral)   Ht 5\' 6"  (1.676 m)   Wt 180 lb (81.6 kg)   SpO2 95%   BMI 29.05 kg/m      Physical Exam Vitals and nursing note reviewed.  Constitutional:      General: She is not in acute distress.    Appearance: Normal appearance. She is ill-appearing. She is not toxic-appearing.  HENT:     Head: Normocephalic and atraumatic.     Nose: Congestion present.     Mouth/Throat:     Mouth: Mucous membranes are moist.      Pharynx: Oropharynx is clear.  Eyes:     General: No scleral icterus.       Right eye: No discharge.        Left eye: No discharge.     Conjunctiva/sclera: Conjunctivae normal.  Cardiovascular:     Rate and Rhythm: Normal rate and regular rhythm.     Heart sounds: Normal heart sounds.  Pulmonary:     Effort: Pulmonary effort is normal. No respiratory distress.     Breath sounds: Wheezing and rhonchi present.  Musculoskeletal:     Cervical back: Neck supple.  Skin:    General: Skin is dry.  Neurological:     General: No focal deficit present.     Mental Status: She is alert. Mental status is at baseline.     Motor: No weakness.     Gait: Gait normal.  Psychiatric:        Mood and Affect: Mood normal.        Behavior: Behavior normal.        Thought Content: Thought content normal.      UC Treatments / Results  Labs (all labs ordered are listed, but only abnormal results are displayed) Labs Reviewed - No data to display  EKG   Radiology No results found.  Procedures Procedures (including critical care time)  Medications Ordered in UC Medications - No data to display  Initial Impression / Assessment and Plan / UC Course  I have reviewed the triage vital signs and the nursing notes.  Pertinent labs & imaging results that were available during my care of the patient were reviewed by me and considered in my medical decision making (see chart for details).   42 y/o female presents for 1 week history of illness. Reports fever, fatigue, body aches, cough and congestion. Also having shortness of breath and feeling like throat is closing up. Taking OTC meds without relief. Symptoms worse today.  Vitals are normal and stable.  On exam has nasal congestion and diffuse wheezing and rhonchi throughout all lung fields.  CXR obtained to assess for possible pneumonia.   Albuterol nebulizer given to patient.  Advised supportive care. Encouraged increasing rest and fluids.  Reviewed return and ED precautions.  *** Final Clinical Impressions(s) / UC Diagnoses   Final diagnoses:  None   Discharge Instructions   None    ED Prescriptions   None    PDMP not reviewed this  encounter.

## 2023-09-30 NOTE — Discharge Instructions (Addendum)
-  You were given a breathing treatment. I sent an inhaler to pharmacy for you -I also sent cough medicine and prednisone to help with cough and wheezing. -Sent antibiotics to pharmacy for you for possible pneumonia.

## 2023-09-30 NOTE — ED Triage Notes (Signed)
Pt c/o temperature of 101.7, body aches, congestion, and cough x1week  Pt has been using OTC tylenol  Pt states that the right side of her neck is sore.  Pt had a Cervical radiculopathy done last year.

## 2023-10-29 ENCOUNTER — Encounter: Payer: Self-pay | Admitting: Emergency Medicine

## 2023-10-29 ENCOUNTER — Ambulatory Visit
Admission: EM | Admit: 2023-10-29 | Discharge: 2023-10-29 | Disposition: A | Discharge status: Home / Self Care | Payer: Medicaid Other | Attending: Physician Assistant | Admitting: Physician Assistant

## 2023-10-29 DIAGNOSIS — K0889 Other specified disorders of teeth and supporting structures: Secondary | ICD-10-CM | POA: Diagnosis not present

## 2023-10-29 DIAGNOSIS — K047 Periapical abscess without sinus: Secondary | ICD-10-CM

## 2023-10-29 MED ORDER — CLINDAMYCIN HCL 150 MG PO CAPS: 450.0000 mg | ORAL_CAPSULE | Freq: Three times a day (TID) | ORAL | 0 refills | Status: AC

## 2023-10-29 MED ORDER — KETOROLAC TROMETHAMINE 30 MG/ML IJ SOLN
30.0000 mg | Freq: Once | INTRAMUSCULAR | Status: AC
  Administered 2023-10-29: 30 mg via INTRAMUSCULAR

## 2023-10-29 MED ORDER — KETOROLAC TROMETHAMINE 10 MG PO TABS: 10.0000 mg | ORAL_TABLET | Freq: Four times a day (QID) | ORAL | 0 refills | Status: AC | PRN

## 2023-10-29 NOTE — ED Provider Notes (Signed)
[facility]    CSN: [id number] Arrival date & time: 10/29/23  1508      History   Chief Complaint Chief Complaint  Patient presents with   Dental Pain    HPI Melanie Howell is a 42 y.o. female presenting for dental pain of the 3 left lower back molars x 3 days.  Reports pain has gotten significantly worse today.  She denies associated fever.  Has swelling along her jaw.  Pain radiating to ear.  Has been taking Tylenol.  Took ibuprofen yesterday but none today.  Has also been applying topical Orajel and another medication she got over-the-counter without relief.  She does have a dentist and plans to contact them tomorrow.  Of note, patient has history of substance abuse and receives Suboxone.   HPI  Past Medical History:  Diagnosis Date   Substance abuse (HCC)     There are no problems to display for this patient.   Past Surgical History:  Procedure Laterality Date   APPENDECTOMY      OB History   No obstetric history on file.      Home Medications    Prior to Admission medications   Medication Sig Start Date End Date Taking? Authorizing Provider  clindamycin (CLEOCIN) 150 MG capsule Take 3 capsules (450 mg total) by mouth 3 (three) times daily for 7 days. 10/29/23 11/05/23 Yes Eusebio Friendly B, PA-C  gabapentin (NEURONTIN) 300 MG capsule Take 1 capsule (300 mg total) by mouth at bedtime as needed for up to 10 days (pain). 05/09/22 10/29/23 Yes Eusebio Friendly B, PA-C  ketorolac (TORADOL) 10 MG tablet Take 1 tablet (10 mg total) by mouth every 6 (six) hours as needed. 10/29/23  Yes Eusebio Friendly B, PA-C  albuterol (VENTOLIN HFA) 108 (90 Base) MCG/ACT inhaler Inhale 1-2 puffs into the lungs every 6 (six) hours as needed for wheezing or shortness of breath. 09/30/23   Shirlee Latch, PA-C  baclofen (LIORESAL) 10 MG tablet Take 1 tablet (10 mg total) by mouth 3 (three) times daily as needed for muscle spasms. 05/09/22   Shirlee Latch, PA-C  buprenorphine-naloxone  (SUBOXONE) 8-2 mg SUBL SL tablet Place 1 tablet under the tongue 2 (two) times daily. 04/26/22   [provider]  Calcium Carb-Cholecalciferol (OYSTER SHELL CALCIUM W/D) 500-5 MG-MCG TABS Take by mouth.    [provider]  DEPO-PROVERA 150 MG/ML injection Inject 150 mg into the muscle every 3 (three) months. 03/23/22   [provider]  promethazine-dextromethorphan (PROMETHAZINE-DM) 6.25-15 MG/5ML syrup Take 5 mLs by mouth 4 (four) times daily as needed. 09/30/23   Shirlee Latch, PA-C    Family History History reviewed. No pertinent family history.  Social History Social History   Tobacco Use   Smoking status: Former    Types: Cigarettes   Smokeless tobacco: Never  Vaping Use   Vaping status: Some Days  Substance Use Topics   Alcohol use: No   Drug use: Not Currently     Allergies   Penicillins   Review of Systems Review of Systems  Constitutional:  Negative for fatigue and fever.  HENT:  Positive for dental problem, ear pain and facial swelling.   Neurological:  Negative for weakness.  Hematological:  Negative for adenopathy.     Physical Exam Triage Vital Signs ED Triage Vitals  Encounter Vitals Group     BP      Systolic BP Percentile      Diastolic BP Percentile  Pulse      Resp      Temp      Temp src      SpO2      Weight      Height      Head Circumference      Peak Flow      Pain Score      Pain Loc      Pain Education      Exclude from Growth Chart    No data found.  Updated Vital Signs BP (!) 144/93 (BP Location: Right Arm)   Pulse 86   Temp 99 F (37.2 C) (Oral)   Resp 14   Ht 5\' 6"  (1.676 m)   Wt 179 lb 14.3 oz (81.6 kg)   SpO2 99%   BMI 29.04 kg/m      Physical Exam Vitals and nursing note reviewed.  Constitutional:      General: She is not in acute distress.    Appearance: Normal appearance. She is not ill-appearing or toxic-appearing.  HENT:     Head: Normocephalic and atraumatic.     Right  Ear: Tympanic membrane, ear canal and external ear normal.     Left Ear: Tympanic membrane, ear canal and external ear normal.     Nose: Nose normal.     Mouth/Throat:     Mouth: Mucous membranes are moist.     Pharynx: Oropharynx is clear.     Comments: Swelling of gingiva along the left lower first and third molars with tenderness.  Swelling of left jaw.  Tenderness present.  Dental abnormality/decay of affected teeth.  Swelling of left anterior cervical lymph nodes. Eyes:     General: No scleral icterus.       Right eye: No discharge.        Left eye: No discharge.     Conjunctiva/sclera: Conjunctivae normal.  Cardiovascular:     Rate and Rhythm: Normal rate and regular rhythm.  Pulmonary:     Effort: Pulmonary effort is normal. No respiratory distress.  Musculoskeletal:     Cervical back: Neck supple.  Skin:    General: Skin is dry.  Neurological:     General: No focal deficit present.     Mental Status: She is alert. Mental status is at baseline.     Motor: No weakness.     Gait: Gait normal.  Psychiatric:        Mood and Affect: Mood normal.        Behavior: Behavior normal.      UC Treatments / Results  Labs (all labs ordered are listed, but only abnormal results are displayed) Labs Reviewed - No data to display  EKG   Radiology No results found.  Procedures Procedures (including critical care time)  Medications Ordered in UC Medications  ketorolac (TORADOL) 30 MG/ML injection 30 mg (has no administration in time range)    Initial Impression / Assessment and Plan / UC Course  I have reviewed the triage vital signs and the nursing notes.  Pertinent labs & imaging results that were available during my care of the patient were reviewed by me and considered in my medical decision making (see chart for details).   42 year old female presents for left-sided lower dental pain x 3 days with worsening of symptoms today.  No associated fever.  Has been taking  Tylenol, Orajel without relief.  Took ibuprofen yesterday.  Has a dentist and will contact them tomorrow.  Evaluation consistent with dental  infection and suspect possible developing abscess.  Has penicillin allergy.  Prescribed clindamycin for 450 mg 3 times daily x 7 days.  She was given injection of ketorolac in clinic for acute pain relief and I sent this medication to pharmacy for pain as needed.  Continue Suboxone, Tylenol, Orajel.  Reviewed return to ER precautions and encouraged her to follow-up with dentist ASAP.   Final Clinical Impressions(s) / UC Diagnoses   Final diagnoses:  Dental abscess  Pain, dental     Discharge Instructions      -Start antibiotics. - Contact your dentist tomorrow. - You have been given a prescription for pain medicine to the pharmacy.  You can also take Tylenol if you need it.  Continue with Orajel as needed as well. - If fever or acute worsening symptoms please go to the ER.     ED Prescriptions     Medication Sig Dispense Auth. Provider   clindamycin (CLEOCIN) 150 MG capsule Take 3 capsules (450 mg total) by mouth 3 (three) times daily for 7 days. 63 capsule Eusebio Friendly B, PA-C   ketorolac (TORADOL) 10 MG tablet Take 1 tablet (10 mg total) by mouth every 6 (six) hours as needed. 20 tablet Shirlee Latch, PA-C      I have reviewed the PDMP during this encounter.   Shirlee Latch, PA-C 10/29/23 1558

## 2023-10-29 NOTE — Discharge Instructions (Addendum)
-  Start antibiotics. - Contact your dentist tomorrow. - You have been given a prescription for pain medicine to the pharmacy.  You can also take Tylenol if you need it.  Continue with Orajel as needed as well. - If fever or acute worsening symptoms please go to the ER.

## 2023-10-29 NOTE — ED Triage Notes (Signed)
Patient reports dental pain on the left side that started 3 days ago.  Patient states that the pain radiates to her left ear.  Patient denies fevers.

## 2023-10-30 ENCOUNTER — Ambulatory Visit: Payer: Medicaid Other

## 2023-11-03 ENCOUNTER — Encounter: Payer: Self-pay | Admitting: Podiatry

## 2023-11-03 ENCOUNTER — Ambulatory Visit (INDEPENDENT_AMBULATORY_CARE_PROVIDER_SITE_OTHER): Payer: Medicaid Other

## 2023-11-03 ENCOUNTER — Ambulatory Visit: Payer: Medicaid Other | Admitting: Podiatry

## 2023-11-03 VITALS — Ht 66.0 in | Wt 179.9 lb

## 2023-11-03 DIAGNOSIS — M722 Plantar fascial fibromatosis: Secondary | ICD-10-CM

## 2023-11-03 DIAGNOSIS — M79672 Pain in left foot: Secondary | ICD-10-CM

## 2023-11-03 MED ORDER — METHYLPREDNISOLONE 4 MG PO TBPK: ORAL_TABLET | ORAL | 0 refills | Status: AC

## 2023-11-03 MED ORDER — BETAMETHASONE SOD PHOS & ACET 6 (3-3) MG/ML IJ SUSP
3.0000 mg | Freq: Once | INTRAMUSCULAR | Status: AC
  Administered 2023-11-03: 3 mg via INTRA_ARTICULAR

## 2023-11-03 NOTE — Progress Notes (Signed)
   Chief Complaint  Patient presents with   Plantar Fasciitis    The pain in my foot has gotten to the point where I could no longer stand to walk on it and I work seven days a week . I've been having the pain in my left heel for the past 3 weeks.    Subjective: 42 y.o. female presenting today as a new patient for evaluation of pain and tenderness associated to the left heel.  Gradual onset over several months.  She wears accommodative insoles with no improvement.   Past Medical History:  Diagnosis Date   Substance abuse [facility])    Past Surgical History:  Procedure Laterality Date   APPENDECTOMY     Allergies  Allergen Reactions   Penicillins      Objective: Physical Exam General: The patient is alert and oriented x3 in no acute distress.  Dermatology: Skin is warm, dry and supple bilateral lower extremities. Negative for open lesions or macerations bilateral.   Vascular: Dorsalis Pedis and Posterior Tibial pulses palpable bilateral.  Capillary fill time is immediate to all digits.  Neurological: Grossly intact via light touch Musculoskeletal: Tenderness to palpation to the plantar aspect of the left heel along the plantar fascia. All other joints range of motion within normal limits bilateral. Strength 5/5 in all groups bilateral.   Radiographic exam LT foot 11/03/2023: Normal osseous mineralization. Joint spaces preserved. No fracture/dislocation/boney destruction. No other soft tissue abnormalities or radiopaque foreign bodies.   Assessment: 1. Plantar fasciitis left foot  Plan of Care:  -Patient evaluated. Xrays reviewed.   -Injection of 0.5cc Celestone soluspan injected into the left plantar fascia.  -Prescription for Medrol Dosepak -Patient has an active prescription for meloxicam 15 mg daily.  Continue as prescribed after completion of the Dosepak -Advise against going barefoot.  Recommend good supportive shoes and sneakers -Recommend OTC prefabricated orthotics,  specifically Powersteps, Superfeet, or Spenco insoles -Return to clinic 4 weeks   Felecia Shelling, DPM [facility]  Dr. Felecia Shelling, DPM    [address] [place], [place] [address]                Office [phone]  Fax [phone]

## 2023-12-01 ENCOUNTER — Ambulatory Visit: Payer: Medicaid Other | Admitting: Podiatry

## 2024-03-13 ENCOUNTER — Other Ambulatory Visit: Payer: Self-pay | Admitting: Family Medicine

## 2024-03-13 DIAGNOSIS — R1013 Epigastric pain: Secondary | ICD-10-CM

## 2024-04-16 ENCOUNTER — Other Ambulatory Visit: Payer: Self-pay | Admitting: Family Medicine

## 2024-04-16 DIAGNOSIS — E041 Nontoxic single thyroid nodule: Secondary | ICD-10-CM

## 2024-06-04 ENCOUNTER — Other Ambulatory Visit: Payer: Self-pay | Admitting: Family Medicine

## 2024-06-04 DIAGNOSIS — R1013 Epigastric pain: Secondary | ICD-10-CM

## 2024-09-02 ENCOUNTER — Other Ambulatory Visit: Payer: Self-pay | Admitting: Family Medicine

## 2024-09-02 DIAGNOSIS — E041 Nontoxic single thyroid nodule: Secondary | ICD-10-CM

## 2024-12-12 ENCOUNTER — Other Ambulatory Visit: Payer: Self-pay | Admitting: Family Medicine

## 2024-12-12 DIAGNOSIS — E041 Nontoxic single thyroid nodule: Secondary | ICD-10-CM

## 2024-12-25 ENCOUNTER — Encounter: Payer: Self-pay | Admitting: *Deleted
